# Patient Record
Sex: Female | Born: 1990 | Race: Black or African American | Hispanic: No | Marital: Single | Smoking: Current every day smoker
Health system: Southern US, Community
[De-identification: ages and names within clinical notes are randomized; demographics above are authoritative.]

## PROBLEM LIST (undated history)

## (undated) ENCOUNTER — Inpatient Hospital Stay (HOSPITAL_COMMUNITY): Payer: Self-pay

## (undated) DIAGNOSIS — Z8639 Personal history of other endocrine, nutritional and metabolic disease: Secondary | ICD-10-CM

## (undated) DIAGNOSIS — B999 Unspecified infectious disease: Secondary | ICD-10-CM

## (undated) DIAGNOSIS — Z8619 Personal history of other infectious and parasitic diseases: Secondary | ICD-10-CM

## (undated) DIAGNOSIS — Z87440 Personal history of urinary (tract) infections: Secondary | ICD-10-CM

## (undated) DIAGNOSIS — A599 Trichomoniasis, unspecified: Secondary | ICD-10-CM

## (undated) DIAGNOSIS — N750 Cyst of Bartholin's gland: Secondary | ICD-10-CM

## (undated) HISTORY — DX: Personal history of other endocrine, nutritional and metabolic disease: Z86.39

## (undated) HISTORY — DX: Personal history of other infectious and parasitic diseases: Z86.19

## (undated) HISTORY — DX: Personal history of urinary (tract) infections: Z87.440

## (undated) HISTORY — PX: NO PAST SURGERIES: SHX2092

## (undated) HISTORY — PX: WISDOM TOOTH EXTRACTION: SHX21

## (undated) HISTORY — DX: Trichomoniasis, unspecified: A59.9

---
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
## 2012-01-18 ENCOUNTER — Inpatient Hospital Stay (HOSPITAL_COMMUNITY)
Admission: AD | Admit: 2012-01-18 | Discharge: 2012-01-18 | Disposition: A | Discharge status: Home / Self Care | Payer: 59 | Source: Ambulatory Visit | Attending: Obstetrics and Gynecology | Admitting: Obstetrics and Gynecology

## 2012-01-18 ENCOUNTER — Encounter (HOSPITAL_COMMUNITY): Payer: Self-pay | Admitting: *Deleted

## 2012-01-18 DIAGNOSIS — O99891 Other specified diseases and conditions complicating pregnancy: Secondary | ICD-10-CM | POA: Insufficient documentation

## 2012-01-18 DIAGNOSIS — O479 False labor, unspecified: Secondary | ICD-10-CM

## 2012-01-18 DIAGNOSIS — R109 Unspecified abdominal pain: Secondary | ICD-10-CM | POA: Insufficient documentation

## 2012-01-18 DIAGNOSIS — O26899 Other specified pregnancy related conditions, unspecified trimester: Secondary | ICD-10-CM

## 2012-01-18 LAB — WET PREP, GENITAL

## 2012-01-18 LAB — AMNISURE RUPTURE OF MEMBRANE (ROM) NOT AT ARMC: Amnisure ROM: NEGATIVE

## 2012-01-18 NOTE — ED Provider Notes (Signed)
History   Jodi Mcdonald is an 21 y.o. Black female at 34 weeks per [facility] 02/29/12 who presents unannounced w/ CC of feeling like her period is starting and having menstrual like cramps, and when cramping happens, feels like something leaking out, and concerned may be leaking amniotic fluid.  Reports GFM.  Denies VB, UTI or PIH s/s; no resp or GI c/o's.  Seen in office Mon of this week and no c/o's at that time and doing well. Size sl greater than dates, so u/s scheduled for next visit for growth and afi.   Pregnancy r/f:  1.  H/o stds  2.  H/o gonorrhea 1st trimester 3.  Obese  4.  teen   Chief Complaint  Patient presents with  . Labor Eval   HPI  OB History    Grav Para Term Preterm Abortions TAB SAB Ect Mult Living   1               Past Medical History  Diagnosis Date  . No pertinent past medical history     Past Surgical History  Procedure Date  . Wisdom tooth extraction     History reviewed. No pertinent family history.  History  Substance Use Topics  . Smoking status: Former Games developer  . Smokeless tobacco: Not on file  . Alcohol Use: No    Allergies: No Known Allergies  Prescriptions prior to admission  Medication Sig Dispense Refill  . acetaminophen (TYLENOL) 500 MG tablet Take 1,000 mg by mouth every 6 (six) hours as needed. For pain      . Prenatal Vit-Fe Fumarate-FA (PRENATAL MULTIVITAMIN) TABS Take 1 tablet by mouth daily.        ROS--see History above Physical Exam   Blood pressure 110/62, pulse 88, temperature 98.8 F (37.1 C), temperature source Oral, resp. rate 20, height 5\' 4"  (1.626 m), weight 107.502 kg (237 lb), SpO2 99.00%.  EFM:150, reactive, mod variability, no decels TOCO: few ripples .Marland Kitchen Results for orders placed during the hospital encounter of 01/18/12 (from the past 24 hour(s))  WET PREP, GENITAL     Status: Abnormal   Collection Time   01/18/12 10:22 PM      Component Value Range   Yeast, Wet Prep NONE SEEN  NONE SEEN    Trich, Wet Prep  NONE SEEN  NONE SEEN    Clue Cells, Wet Prep FEW (*) NONE SEEN    WBC, Wet Prep HPF POC FEW (*) NONE SEEN   AMNISURE RUPTURE OF MEMBRANE (ROM)     Status: Normal   Collection Time   01/18/12 10:22 PM      Component Value Range   Amnisure ROM NEGATIVE     Physical Exam  Constitutional: She is oriented to person, place, and time. She appears well-developed and well-nourished. No distress.       Smiling, laughing  Cardiovascular: Normal rate.   Respiratory: Effort normal.  GI: Soft. Bowel sounds are normal.       gravid  Genitourinary:       Perineum/vulva dry;  SSE:  Scant amt white d/c in vault; neg pooling.  No lesion.  Cx L/closed/-3  Neurological: She is alert and oriented to person, place, and time.  Skin: Skin is warm and dry.    MAU Course  Procedures 1.  NST 2.  Wet prep 3.  amnisure  Assessment and Plan  1.  IUP at 34 weeks 2.  No s/s of PTL or ROM 3.  Reactive NST  1.  F/u as scheduled or prn worsening s/s 2.  Disc'd normal discomforts for end of 3rd trimester and comfort measures rev'd 3.  PTL precautions and FKC rev'd    Shailah Gibbins H 01/18/2012, 10:30 PM

## 2012-01-18 NOTE — Progress Notes (Signed)
SSE per CNM.  Wet prep and amnisure collected. VE done.

## 2012-01-18 NOTE — Progress Notes (Signed)
H. Steelman, CNM at bedside.  Assessment done and poc discussed with pt.  

## 2012-01-18 NOTE — Progress Notes (Signed)
Pt states she noticed leaking that started 2-3 days ago and has developed pelvic pressure

## 2012-02-27 ENCOUNTER — Encounter (INDEPENDENT_AMBULATORY_CARE_PROVIDER_SITE_OTHER): Payer: 59 | Admitting: Obstetrics and Gynecology

## 2012-02-27 DIAGNOSIS — Z3480 Encounter for supervision of other normal pregnancy, unspecified trimester: Secondary | ICD-10-CM

## 2012-03-05 ENCOUNTER — Inpatient Hospital Stay (HOSPITAL_COMMUNITY)
Admission: AD | Admit: 2012-03-05 | Discharge: 2012-03-08 | DRG: 775 | Disposition: A | Discharge status: Home / Self Care | Payer: Medicaid Other | Source: Ambulatory Visit | Attending: Obstetrics and Gynecology | Admitting: Obstetrics and Gynecology

## 2012-03-05 ENCOUNTER — Encounter (INDEPENDENT_AMBULATORY_CARE_PROVIDER_SITE_OTHER): Payer: 59 | Admitting: Registered Nurse

## 2012-03-05 ENCOUNTER — Other Ambulatory Visit (INDEPENDENT_AMBULATORY_CARE_PROVIDER_SITE_OTHER): Payer: 59

## 2012-03-05 ENCOUNTER — Encounter (HOSPITAL_COMMUNITY): Payer: Self-pay | Admitting: *Deleted

## 2012-03-05 DIAGNOSIS — Z22330 Carrier of Group B streptococcus: Secondary | ICD-10-CM

## 2012-03-05 DIAGNOSIS — O2690 Pregnancy related conditions, unspecified, unspecified trimester: Secondary | ICD-10-CM

## 2012-03-05 DIAGNOSIS — O2693 Pregnancy related conditions, unspecified, third trimester: Secondary | ICD-10-CM

## 2012-03-05 DIAGNOSIS — O4100X0 Oligohydramnios, unspecified trimester, not applicable or unspecified: Principal | ICD-10-CM | POA: Diagnosis present

## 2012-03-05 DIAGNOSIS — O480 Post-term pregnancy: Secondary | ICD-10-CM

## 2012-03-05 DIAGNOSIS — O99892 Other specified diseases and conditions complicating childbirth: Secondary | ICD-10-CM | POA: Diagnosis present

## 2012-03-05 DIAGNOSIS — Z3480 Encounter for supervision of other normal pregnancy, unspecified trimester: Secondary | ICD-10-CM

## 2012-03-05 DIAGNOSIS — O368190 Decreased fetal movements, unspecified trimester, not applicable or unspecified: Secondary | ICD-10-CM | POA: Diagnosis not present

## 2012-03-05 LAB — CBC
MCH: 28.3 pg (ref 26.0–34.0)
MCHC: 34 g/dL (ref 30.0–36.0)
MCV: 83.3 fL (ref 78.0–100.0)
Platelets: 258 10*3/uL (ref 150–400)
RBC: 4.49 MIL/uL (ref 3.87–5.11)

## 2012-03-05 MED ORDER — LACTATED RINGERS IV SOLN
INTRAVENOUS | Status: DC
  Administered 2012-03-05 – 2012-03-06 (×4): via INTRAVENOUS

## 2012-03-05 MED ORDER — LACTATED RINGERS IV SOLN
500.0000 mL | INTRAVENOUS | Status: DC | PRN
  Administered 2012-03-06: 1000 mL via INTRAVENOUS

## 2012-03-05 MED ORDER — ZOLPIDEM TARTRATE 10 MG PO TABS
10.0000 mg | ORAL_TABLET | Freq: Every evening | ORAL | Status: DC | PRN
  Administered 2012-03-05: 10 mg via ORAL
  Filled 2012-03-05: qty 1

## 2012-03-05 MED ORDER — DINOPROSTONE 10 MG VA INST
10.0000 mg | VAGINAL_INSERT | Freq: Once | VAGINAL | Status: AC
  Administered 2012-03-05: 10 mg via VAGINAL
  Filled 2012-03-05: qty 1

## 2012-03-05 MED ORDER — PENICILLIN G POTASSIUM 5000000 UNITS IJ SOLR
5.0000 10*6.[IU] | Freq: Once | INTRAMUSCULAR | Status: DC
  Filled 2012-03-05: qty 5

## 2012-03-05 MED ORDER — TERBUTALINE SULFATE 1 MG/ML IJ SOLN: 0.2500 mg | Freq: Once | INTRAMUSCULAR | Status: AC | PRN

## 2012-03-05 MED ORDER — OXYCODONE-ACETAMINOPHEN 5-325 MG PO TABS: 1.0000 | ORAL_TABLET | ORAL | Status: DC | PRN

## 2012-03-05 MED ORDER — OXYTOCIN 20 UNITS IN LACTATED RINGERS INFUSION - SIMPLE: 125.0000 mL/h | Freq: Once | INTRAVENOUS | Status: DC

## 2012-03-05 MED ORDER — IBUPROFEN 600 MG PO TABS: 600.0000 mg | ORAL_TABLET | Freq: Four times a day (QID) | ORAL | Status: DC | PRN

## 2012-03-05 MED ORDER — OXYTOCIN BOLUS FROM INFUSION
500.0000 mL | Freq: Once | INTRAVENOUS | Status: DC
  Filled 2012-03-05: qty 500

## 2012-03-05 MED ORDER — FLEET ENEMA 7-19 GM/118ML RE ENEM: 1.0000 | ENEMA | RECTAL | Status: DC | PRN

## 2012-03-05 MED ORDER — DEXTROSE 5 % IV SOLN
2.5000 10*6.[IU] | INTRAVENOUS | Status: DC
  Filled 2012-03-05 (×2): qty 2.5

## 2012-03-05 MED ORDER — CITRIC ACID-SODIUM CITRATE 334-500 MG/5ML PO SOLN: 30.0000 mL | ORAL | Status: DC | PRN

## 2012-03-05 MED ORDER — ACETAMINOPHEN 325 MG PO TABS: 650.0000 mg | ORAL_TABLET | ORAL | Status: DC | PRN

## 2012-03-05 MED ORDER — LIDOCAINE HCL (PF) 1 % IJ SOLN
30.0000 mL | INTRAMUSCULAR | Status: DC | PRN
  Administered 2012-03-06: 30 mL via SUBCUTANEOUS
  Filled 2012-03-05: qty 30

## 2012-03-05 MED ORDER — ONDANSETRON HCL 4 MG/2ML IJ SOLN: 4.0000 mg | Freq: Four times a day (QID) | INTRAMUSCULAR | Status: DC | PRN

## 2012-03-05 NOTE — H&P (Signed)
Jodi Mcdonald is a 21 y.o. female presenting for low AFI, denie uc, srom, vag bleeding, with +FM, agrees to cervidil induction. Problem list Gonorrhea Obesity AFI 5  History OB History    Grav Para Term Preterm Abortions TAB SAB Ect Mult Living   1              Past Medical History  Diagnosis Date  . No pertinent past medical history    Past Surgical History  Procedure Date  . Wisdom tooth extraction    Family History: family history is negative for Anesthesia problems. Social History:  reports that she has quit smoking. She does not have any smokeless tobacco history on file. She reports that she does not drink alcohol or use illicit drugs.  ROS  Dilation: 1 Effacement (%): 60 Station: -2 Exam by:: Kareem Cathey,cnm Blood pressure 119/69, pulse 83, temperature 98.4 F (36.9 C), temperature source Oral, resp. rate 20, height 5\' 4"  (1.626 m), weight 230 lb (104.327 kg), SpO2 100.00%. Exam Physical Exam calm, quiet, lungs clear bilaterally, AP RRR, abd soft, gravid, nt EGBUS WNL, vag 1 posterior 60 medium intact, vtx no edema lower legs Prenatal labs: ABO, Rh: --/--/O POS (06/29 0951)O post Antibody:  neg Rubella:  immune RPR:   NR HBsAg:   Neg HIV:   neg GBS: Positive (02/09 0000)   Assessment/Plan:  40 5/7 week IP oligohydraminos P cervidil, pitocin discussed with pt family at bedside and Pen G with active labor, collaboration with Dr. Max Fickle, [facility] 03/05/2012, 4:29 PM

## 2012-03-06 ENCOUNTER — Encounter (HOSPITAL_COMMUNITY): Payer: Self-pay | Admitting: Anesthesiology

## 2012-03-06 ENCOUNTER — Encounter (HOSPITAL_COMMUNITY): Payer: Self-pay | Admitting: Family Medicine

## 2012-03-06 ENCOUNTER — Inpatient Hospital Stay (HOSPITAL_COMMUNITY): Payer: Medicaid Other | Admitting: Anesthesiology

## 2012-03-06 MED ORDER — LANOLIN HYDROUS EX OINT: TOPICAL_OINTMENT | CUTANEOUS | Status: DC | PRN

## 2012-03-06 MED ORDER — MEDROXYPROGESTERONE ACETATE 150 MG/ML IM SUSP: 150.0000 mg | INTRAMUSCULAR | Status: DC | PRN

## 2012-03-06 MED ORDER — METHYLERGONOVINE MALEATE 0.2 MG PO TABS: 0.2000 mg | ORAL_TABLET | ORAL | Status: DC | PRN

## 2012-03-06 MED ORDER — EPHEDRINE 5 MG/ML INJ
10.0000 mg | INTRAVENOUS | Status: DC | PRN
  Filled 2012-03-06: qty 2
  Filled 2012-03-06: qty 4

## 2012-03-06 MED ORDER — ONDANSETRON HCL 4 MG/2ML IJ SOLN: 4.0000 mg | INTRAMUSCULAR | Status: DC | PRN

## 2012-03-06 MED ORDER — TETANUS-DIPHTH-ACELL PERTUSSIS 5-2.5-18.5 LF-MCG/0.5 IM SUSP: 0.5000 mL | Freq: Once | INTRAMUSCULAR | Status: DC

## 2012-03-06 MED ORDER — FENTANYL 2.5 MCG/ML BUPIVACAINE 1/10 % EPIDURAL INFUSION (WH - ANES)
INTRAMUSCULAR | Status: DC | PRN
  Administered 2012-03-06: 14 mL/h via EPIDURAL

## 2012-03-06 MED ORDER — DIPHENHYDRAMINE HCL 50 MG/ML IJ SOLN: 12.5000 mg | INTRAMUSCULAR | Status: DC | PRN

## 2012-03-06 MED ORDER — EPHEDRINE 5 MG/ML INJ
10.0000 mg | INTRAVENOUS | Status: DC | PRN
  Filled 2012-03-06: qty 2

## 2012-03-06 MED ORDER — FENTANYL 2.5 MCG/ML BUPIVACAINE 1/10 % EPIDURAL INFUSION (WH - ANES)
14.0000 mL/h | INTRAMUSCULAR | Status: DC
  Administered 2012-03-06: 14 mL/h via EPIDURAL
  Filled 2012-03-06 (×2): qty 60

## 2012-03-06 MED ORDER — BENZOCAINE-MENTHOL 20-0.5 % EX AERO: 1.0000 "application " | INHALATION_SPRAY | CUTANEOUS | Status: DC | PRN

## 2012-03-06 MED ORDER — PHENYLEPHRINE 40 MCG/ML (10ML) SYRINGE FOR IV PUSH (FOR BLOOD PRESSURE SUPPORT)
80.0000 ug | PREFILLED_SYRINGE | INTRAVENOUS | Status: DC | PRN
  Filled 2012-03-06: qty 5
  Filled 2012-03-06: qty 2

## 2012-03-06 MED ORDER — FENTANYL CITRATE 0.05 MG/ML IJ SOLN: 100.0000 ug | INTRAMUSCULAR | Status: DC | PRN

## 2012-03-06 MED ORDER — OXYCODONE-ACETAMINOPHEN 5-325 MG PO TABS: 1.0000 | ORAL_TABLET | ORAL | Status: DC | PRN

## 2012-03-06 MED ORDER — TERBUTALINE SULFATE 1 MG/ML IJ SOLN: 0.2500 mg | Freq: Once | INTRAMUSCULAR | Status: DC | PRN

## 2012-03-06 MED ORDER — BUTORPHANOL TARTRATE 2 MG/ML IJ SOLN
INTRAMUSCULAR | Status: AC
  Filled 2012-03-06: qty 1

## 2012-03-06 MED ORDER — LACTATED RINGERS IV SOLN
500.0000 mL | Freq: Once | INTRAVENOUS | Status: AC
  Administered 2012-03-06: 500 mL via INTRAVENOUS

## 2012-03-06 MED ORDER — IBUPROFEN 600 MG PO TABS
600.0000 mg | ORAL_TABLET | Freq: Four times a day (QID) | ORAL | Status: DC
  Administered 2012-03-06 – 2012-03-08 (×8): 600 mg via ORAL
  Filled 2012-03-06 (×7): qty 1

## 2012-03-06 MED ORDER — METHYLERGONOVINE MALEATE 0.2 MG/ML IJ SOLN: 0.2000 mg | INTRAMUSCULAR | Status: DC | PRN

## 2012-03-06 MED ORDER — PRENATAL MULTIVITAMIN CH
1.0000 | ORAL_TABLET | Freq: Every day | ORAL | Status: DC
  Administered 2012-03-07 – 2012-03-08 (×2): 1 via ORAL
  Filled 2012-03-06 (×2): qty 1

## 2012-03-06 MED ORDER — ZOLPIDEM TARTRATE 5 MG PO TABS: 5.0000 mg | ORAL_TABLET | Freq: Every evening | ORAL | Status: DC | PRN

## 2012-03-06 MED ORDER — MAGNESIUM HYDROXIDE 400 MG/5ML PO SUSP: 30.0000 mL | ORAL | Status: DC | PRN

## 2012-03-06 MED ORDER — SIMETHICONE 80 MG PO CHEW: 80.0000 mg | CHEWABLE_TABLET | ORAL | Status: DC | PRN

## 2012-03-06 MED ORDER — FERROUS SULFATE 325 (65 FE) MG PO TABS
325.0000 mg | ORAL_TABLET | Freq: Two times a day (BID) | ORAL | Status: DC
  Administered 2012-03-07 – 2012-03-08 (×4): 325 mg via ORAL
  Filled 2012-03-06 (×4): qty 1

## 2012-03-06 MED ORDER — BUTORPHANOL TARTRATE 2 MG/ML IJ SOLN
1.0000 mg | INTRAMUSCULAR | Status: DC | PRN
  Administered 2012-03-06 (×2): 1 mg via INTRAVENOUS
  Filled 2012-03-06: qty 1

## 2012-03-06 MED ORDER — WITCH HAZEL-GLYCERIN EX PADS: 1.0000 "application " | MEDICATED_PAD | CUTANEOUS | Status: DC | PRN

## 2012-03-06 MED ORDER — ONDANSETRON HCL 4 MG PO TABS: 4.0000 mg | ORAL_TABLET | ORAL | Status: DC | PRN

## 2012-03-06 MED ORDER — PENICILLIN G POTASSIUM 5000000 UNITS IJ SOLR
5.0000 10*6.[IU] | Freq: Once | INTRAVENOUS | Status: AC
  Administered 2012-03-06: 5 10*6.[IU] via INTRAVENOUS
  Filled 2012-03-06: qty 5

## 2012-03-06 MED ORDER — LIDOCAINE HCL (PF) 1 % IJ SOLN
INTRAMUSCULAR | Status: DC | PRN
  Administered 2012-03-06: 8 mL
  Administered 2012-03-06: 6 mL

## 2012-03-06 MED ORDER — OXYTOCIN 20 UNITS IN LACTATED RINGERS INFUSION - SIMPLE
1.0000 m[IU]/min | INTRAVENOUS | Status: DC
  Administered 2012-03-06: 2 m[IU]/min via INTRAVENOUS
  Filled 2012-03-06: qty 1000

## 2012-03-06 MED ORDER — DIBUCAINE 1 % RE OINT: 1.0000 "application " | TOPICAL_OINTMENT | RECTAL | Status: DC | PRN

## 2012-03-06 MED ORDER — PHENYLEPHRINE 40 MCG/ML (10ML) SYRINGE FOR IV PUSH (FOR BLOOD PRESSURE SUPPORT)
80.0000 ug | PREFILLED_SYRINGE | INTRAVENOUS | Status: DC | PRN
  Filled 2012-03-06: qty 2

## 2012-03-06 MED ORDER — SENNOSIDES-DOCUSATE SODIUM 8.6-50 MG PO TABS
2.0000 | ORAL_TABLET | Freq: Every day | ORAL | Status: DC
  Administered 2012-03-07: 2 via ORAL

## 2012-03-06 MED ORDER — LACTATED RINGERS IV SOLN
INTRAVENOUS | Status: DC
  Administered 2012-03-06: 18:00:00 via INTRAUTERINE

## 2012-03-06 MED ORDER — MISOPROSTOL 200 MCG PO TABS
ORAL_TABLET | ORAL | Status: AC
  Administered 2012-03-06: 800 ug
  Filled 2012-03-06: qty 4

## 2012-03-06 MED ORDER — DIPHENHYDRAMINE HCL 25 MG PO CAPS: 25.0000 mg | ORAL_CAPSULE | Freq: Four times a day (QID) | ORAL | Status: DC | PRN

## 2012-03-06 MED ORDER — PENICILLIN G POTASSIUM 5000000 UNITS IJ SOLR
2.5000 10*6.[IU] | INTRAVENOUS | Status: DC
  Administered 2012-03-06 (×3): 2.5 10*6.[IU] via INTRAVENOUS
  Filled 2012-03-06 (×5): qty 2.5

## 2012-03-06 NOTE — Progress Notes (Signed)
  Subjective: Breathing with contractions, declines pain medication at present.  Had small amount of bleeding when up to BR.  No further leaking.  Objective: BP 120/73  Pulse 90  Temp(Src) 98 F (36.7 C) (Oral)  Resp 18  Ht 5\' 4"  (1.626 m)  Wt 104.327 kg (230 lb)  BMI 39.48 kg/m2  SpO2 100%      FHT: Category 1 UC:   q 2-4 min, mild. SVE:   Dilation: 2 Effacement (%): 90 Station: -2 Exam by:: Manfred Arch, CNM Membranes palpated, no leaking noted.  Minimal bloody show with exam. Pitocin on 8 mu/min  Labs: Lab Results  Component Value Date   WBC 10.1 03/05/2012   HGB 12.7 03/05/2012   HCT 37.4 03/05/2012   MCV 83.3 03/05/2012   PLT 258 03/05/2012    Assessment / Plan: IUP at 40 6/7 weeks Induction for oligohydramnios, decreased FM  Continue induction with pitocin IV pain medication as needed (patient prefers to epidural) AROM as labor progresses.    Nigel Bridgeman 03/06/2012, 9:21 AM

## 2012-03-06 NOTE — Progress Notes (Signed)
H. Steelman, cnm at bedside to remove cervidil, unable to find cervidil.

## 2012-03-06 NOTE — Anesthesia Preprocedure Evaluation (Signed)
Anesthesia Evaluation  Patient identified by MRN, date of birth, ID band Patient awake and Patient unresponsive    Reviewed: Allergy & Precautions, H&P , NPO status , Patient's Chart, lab work & pertinent test results  Airway Mallampati: III TM Distance: >3 FB Neck ROM: full    Dental No notable dental hx.    Pulmonary neg pulmonary ROS,    Pulmonary exam normal       Cardiovascular negative cardio ROS      Neuro/Psych negative neurological ROS  negative psych ROS   GI/Hepatic negative GI ROS, Neg liver ROS,   Endo/Other  Morbid obesity  Renal/GU negative Renal ROS  negative genitourinary   Musculoskeletal negative musculoskeletal ROS (+)   Abdominal (+) + obese,   Peds negative pediatric ROS (+)  Hematology negative hematology ROS (+)   Anesthesia Other Findings   Reproductive/Obstetrics (+) Pregnancy                           Anesthesia Physical Anesthesia Plan  ASA: III  Anesthesia Plan: Epidural   Post-op Pain Management:    Induction:   Airway Management Planned:   Additional Equipment:   Intra-op Plan:   Post-operative Plan:   Informed Consent: I have reviewed the patients History and Physical, chart, labs and discussed the procedure including the risks, benefits and alternatives for the proposed anesthesia with the patient or authorized representative who has indicated his/her understanding and acceptance.     Plan Discussed with:   Anesthesia Plan Comments:         Anesthesia Quick Evaluation

## 2012-03-06 NOTE — Progress Notes (Signed)
  Subjective: Comfortable with epidural.  Sleeping at intervals--mother, FOB, and patient's brother in room.  Objective: BP 110/38  Pulse 84  Temp(Src) 97.9 F (36.6 C) (Oral)  Resp 18  Ht 5\' 4"  (1.626 m)  Wt 104.327 kg (230 lb)  BMI 39.48 kg/m2  SpO2 100%     Filed Vitals:   03/06/12 1423 03/06/12 1427 03/06/12 1432 03/06/12 1502  BP:  128/77 115/60 110/38  Pulse: 81 78 80 84  Temp:    97.9 F (36.6 C)  TempSrc:    Oral  Resp:  16 18 18   Height:      Weight:      SpO2:         FHT: 140s, minimal variability s/p 2 doses Stadol.   UC:   q 2-4 min, moderate quality. SVE:   Dilation: 5.5 Effacement (%): 90 Station: -2 Exam by:: Manfred Arch, CNM Membranes intact, vtx transverse position. Small amount bloody show.   Assessment / Plan: Induction of labor due to oligohydramnios Progression of labor  Plan: Defer AROM at present, due to fetal vtx still slightly high for AROM Continue pitocin to facilitate descent and dilation. Evaluate for AROM as labor progresses.   Nigel Bridgeman 03/06/2012, 3:28 PM

## 2012-03-06 NOTE — Progress Notes (Signed)
NSVD of a viable female.  

## 2012-03-06 NOTE — Progress Notes (Signed)
  Subjective: Comfortable with epidural.  Objective: BP 109/68  Pulse 99  Temp(Src) 98 F (36.7 C) (Axillary)  Resp 18  Ht 5\' 4"  (1.626 m)  Wt 104.327 kg (230 lb)  BMI 39.48 kg/m2  SpO2 100%   Total I/O In: 500 [Other:500] Out: -   FHT: 140s baseline, moderate variability, mild variables just after AROM. UC:   q 3 min per palpation, difficult to trace SVE:  8 cm, 100%, vtx, -1/0 station. AROM--minimal amount light MSF IUPC and FSE placed   Assessment / Plan: Transition labor Light MSF, oligo Mild variables s/p AROM  Will initiate amnioinfusion and observe. Await increased pressure and urge to push.  Nigel Bridgeman 03/06/2012, 5:56 PM

## 2012-03-06 NOTE — Progress Notes (Signed)
Jodi Mcdonald is a 21 y.o. G1P0000 at [redacted]w[redacted]d admitted for induction of labor due to Low amniotic fluid..  Subjective: Sleeping soundly on arrival to room.  S.o. At bedside.  Pt had ambien last night, and has slept well.  Objective: BP 108/67  Pulse 107  Temp(Src) 98.1 F (36.7 C) (Oral)  Resp 17  Ht 5\' 4"  (1.626 m)  Wt 104.327 kg (230 lb)  BMI 39.48 kg/m2  SpO2 100%      FHT:  FHR: 160 bpm, variability: moderate,  accelerations:  Present,  decelerations:  Present occ'l mild variable UC:   Occasional ctx SVE:   Dilation: 1.5 Effacement (%): 80 Station: -2 Exam by:: H. Tamika Nou, cnm No cervidil in vault, and not found in bed. Labs: Lab Results  Component Value Date   WBC 10.1 03/05/2012   HGB 12.7 03/05/2012   HCT 37.4 03/05/2012   MCV 83.3 03/05/2012   PLT 258 03/05/2012    Assessment / Plan: 1.  40.6  2.  IOL secondary to Oligo  3.  GBS pos  Labor: Cervidil dislodged at some time during night, and pt unaware Preeclampsia:  no signs or symptoms of toxicity Fetal Wellbeing:  Category I Pain Control:  Labor support without medications I/D:  n/a Anticipated MOD:  NSVD 1.  Will begin pitocin and PCN at 0600. Wolf Boulay H 03/06/2012, 5:26 AM

## 2012-03-06 NOTE — Anesthesia Procedure Notes (Signed)
Epidural Patient location during procedure: OB Start time: 03/06/2012 2:00 PM End time: 03/06/2012 2:04 PM Reason for block: procedure for pain  Staffing Anesthesiologist: Sandrea Hughs Performed by: anesthesiologist   Preanesthetic Checklist Completed: patient identified, site marked, surgical consent, pre-op evaluation, timeout performed, IV checked, risks and benefits discussed and monitors and equipment checked  Epidural Patient position: sitting Prep: site prepped and draped and DuraPrep Patient monitoring: continuous pulse ox and blood pressure Approach: midline Injection technique: LOR air  Needle:  Needle type: Tuohy  Needle gauge: 17 G Needle length: 9 cm Needle insertion depth: 6 cm Catheter type: closed end flexible Catheter size: 19 Gauge Catheter at skin depth: 11 cm Test dose: negative and Other  Assessment Sensory level: T9 Events: blood not aspirated, injection not painful, no injection resistance, negative IV test and no paresthesia

## 2012-03-06 NOTE — Progress Notes (Signed)
  Subjective: Sleeping at present--received 2nd dose Stadol at 12 noon.  Objective: BP 137/62  Pulse 84  Temp(Src) 97.9 F (36.6 C) (Oral)  Resp 18  Ht 5\' 4"  (1.626 m)  Wt 104.327 kg (230 lb)  BMI 39.48 kg/m2  SpO2 100%      FHT:  Category 1 UC:    q 3 minutes  SVE:   Dilation: 3.5 Effacement (%): 90 Station: -2 Exam by:: Tressia Danas RN  Pitocin on 12 mu/min.  Assessment / Plan: Induction of labor due to oligohydramnios, decreased FM  Will continue to observe.  Nigel Bridgeman 03/06/2012, 12:49 PM

## 2012-03-07 LAB — CBC
HCT: 32.6 % — ABNORMAL LOW (ref 36.0–46.0)
Hemoglobin: 10.7 g/dL — ABNORMAL LOW (ref 12.0–15.0)
MCH: 27.8 pg (ref 26.0–34.0)
MCHC: 32.8 g/dL (ref 30.0–36.0)
MCV: 84.7 fL (ref 78.0–100.0)

## 2012-03-07 NOTE — Progress Notes (Signed)
UR chart review completed.  

## 2012-03-07 NOTE — Anesthesia Postprocedure Evaluation (Signed)
Anesthesia Post Note  Patient: Jodi Mcdonald  Procedure(s) Performed: * No procedures listed *  Anesthesia type: Epidural  Patient location: Mother/Baby  Post pain: Pain level controlled  Post assessment: Post-op Vital signs reviewed  Last Vitals:  Filed Vitals:   03/07/12 0300  BP: 107/64  Pulse: 80  Temp: 37.4 C  Resp: 16    Post vital signs: Reviewed  Level of consciousness: awake  Complications: No apparent anesthesia complications

## 2012-03-07 NOTE — Progress Notes (Signed)
Patient ID: Jodi Mcdonald, female   DOB: 24-Jun-1991, 21 y.o.   MRN: [id number] Post Partum Day 1 Subjective: no complaints, up ad lib without syncope, voiding, tolerating PO, + flatus  Denies any pain BF started Mood stable, bonding well   Objective: Blood pressure 107/64, pulse 80, temperature 99.3 F (37.4 C), temperature source Oral, resp. rate 16, height 5\' 4"  (1.626 m), weight 104.327 kg (230 lb), SpO2 99.00%, unknown if currently breastfeeding.  Physical Exam:  General: alert and no distress Lungs: CTAB Heart: RRR Breasts: soft, nipples intact Lochia: appropriate Uterine Fundus: firm Perineum: WNL DVT Evaluation: No evidence of DVT seen on physical exam. Negative Homan's sign. No significant calf/ankle edema.   Basename 03/07/12 0558 03/05/12 1700  HGB 10.7* 12.7  HCT 32.6* 37.4    Assessment/Plan: Plan for discharge tomorrow, Breastfeeding, Lactation consult and Contraception undecided, discuss various methods Stable PP Continue current care Plans OP circumcision      LOS: 2 days   Syair Fricker M 03/07/2012, 8:00 AM

## 2012-03-07 NOTE — Addendum Note (Signed)
Addendum  created 03/07/12 1058 by Graciela Husbands, CRNA   Modules edited:Charges VN, Notes Section

## 2012-03-07 NOTE — Anesthesia Postprocedure Evaluation (Signed)
  Anesthesia Post-op Note  Patient: Jodi Mcdonald  Procedure(s) Performed: * No procedures listed *  Patient Location: 124  Anesthesia Type: Epidural  Level of Consciousness: awake, alert  and oriented  Airway and Oxygen Therapy: Patient Spontanous Breathing  Post-op Pain: mild  Post-op Assessment: Post-op Vital signs reviewed, Patient's Cardiovascular Status Stable, No headache, No backache, No residual numbness and No residual motor weakness  Post-op Vital Signs: Reviewed and stable  Complications: No apparent anesthesia complications

## 2012-03-08 MED ORDER — IBUPROFEN 600 MG PO TABS: 600.0000 mg | ORAL_TABLET | Freq: Four times a day (QID) | ORAL | Status: AC | PRN

## 2012-03-08 MED ORDER — FERROUS SULFATE 325 (65 FE) MG PO TABS: 325.0000 mg | ORAL_TABLET | Freq: Two times a day (BID) | ORAL | Status: DC

## 2012-03-08 NOTE — Discharge Instructions (Signed)
Vaginal Birth After Cesarean Delivery Vaginal birth after Cesarean delivery (VBAC) is giving birth vaginally after previously delivering a baby by a cesarean. In the past, if a woman had a Cesarean delivery, all births afterwards would be done by Cesarean delivery. This is no longer true. It can be safe for the mother to try a vaginal delivery after having a Cesarean. The final decision to have a VBAC or repeat Cesarean delivery should be between the patient and her caregiver. The risks and benefits can be discussed relative to the reason for, and the type of the previous Cesarean delivery. WOMEN WHO PLAN TO HAVE A VBAC SHOULD CHECK WITH THEIR DOCTOR TO BE SURE THAT:  The previous Cesarean was done with a low transverse uterine incision (not a vertical classical incision).   The birth canal is big enough for the baby.   There were no other operations on the uterus.   They will have an electronic fetal monitor (EFM) on at all times during labor.   An operating room would be available and ready in case an emergency Cesarean is needed.   A doctor and surgical nursing staff would be available at all times during labor to be ready to do an emergency Cesarean if necessary.   An anesthesiologist would be present in case an emergency Cesarean is needed.   The nursery is prepared and has adequate personnel and necessary equipment available to care for the baby in case of an emergency Cesarean.  BENEFITS OF VBAC:  Shorter stay in the hospital.   Lower delivery, nursery and hospital costs.   Less blood loss and need for blood transfusions.   Less fever and discomfort from major surgery.   Lower risk of blood clots.   Lower risk of infection.   Shorter recovery after going home.   Lower risk of other surgical complications, such as opening of the incision or hernia in the incision.   Decreased risk of injury to other organs.   Decreased risk for having to remove the uterus (hysterectomy).     Decreased risk for the placenta to completely or partially cover the opening of the uterus (placenta previa) with a future pregnancy.   Ability to have a larger family if desired.  RISKS OF A VBAC:  Rupture of the uterus.   Having to remove the uterus (hysterectomy) if it ruptures.   All the complications of major surgery and/or injury to other organs.   Excessive bleeding, blood clots and infection.   Lower Apgar scores (method to evaluate the newborn based on appearance, pulse, grimace, activity, and respiration) and more risks to the baby.   There is a higher risk of uterine rupture if you induce or augment labor.   There is a higher risk of uterine rupture if you use medications to ripen the cervix.  VBAC SHOULD NOT BE DONE IF:  The previous Cesarean was done with a vertical (classical) or T-shaped incision, or you do not know what kind of an incision was made.   You had a ruptured uterus.   You had surgery on your uterus.   You have medical or obstetrical problems.   There are problems with the baby.   There were two previous Cesarean deliveries and no vaginal deliveries.  OTHER FACTS TO KNOW ABOUT VBAC:  It is safe to have an epidural anesthetic with VBAC.   It is safe to turn the baby from a breech position (attempt an external cephalic version).   It is  safe to try a VBAC with twins.   Pregnancies later than 40 weeks have not been successful with VBAC.   There is an increased failure rate of a VBAC in obese pregnant women.   There is an increased failure rate with VABC if the baby weighs 8.8 pounds (4000 grams) or more.   There is an increased failure rate if the time between the Cesarean and VBAC is less than 19 months.   There is an increased failure rate if pre-eclampsia is present (high blood pressure, protein in the urine and swelling of face and extremities).   VBAC is very successful if there was a previous vaginal birth.   VBAC is very successful  when the labor starts spontaneously before the due date.   Delivery of VBAC is similar to having a normal spontaneous vaginal delivery.  It is important to discuss VBAC with your caregiver early in the pregnancy so you can understand the risks, benefits and options. It will give you time to decide what is best in your particular case relevant to the reason for your previous Cesarean delivery. It should be understood that medical changes in the mother or pregnancy may occur during the pregnancy, which make it necessary to change you or your caregiver's initial decision. The counseling, concerns and decisions should be documented in the medical record and signed by all parties.  Breastfeeding BENEFITS OF BREASTFEEDING For the baby  The first milk (colostrum) helps the baby's digestive system function better.   There are antibodies from the mother in the milk that help the baby fight off infections.   The baby has a lower incidence of asthma, allergies, and SIDS (sudden infant death syndrome).   The nutrients in breast milk are better than formulas for the baby and helps the baby's brain grow better.   Babies who breastfeed have less gas, colic, and constipation.  For the mother  Breastfeeding helps develop a very special bond between mother and baby.   It is more convenient, always available at the correct temperature and cheaper than formula feeding.   It burns calories in the mother and helps with losing weight that was gained during pregnancy.   It makes the uterus contract back down to normal size faster and slows bleeding following delivery.   Breastfeeding mothers have a lower risk of developing breast cancer.  NURSE FREQUENTLY  A healthy, full-term baby may breastfeed as often as every hour or space his or her feedings to every 3 hours.   How often to nurse will vary from baby to baby. Watch your baby for signs of hunger, not the clock.   Nurse as often as the baby requests,  or when you feel the need to reduce the fullness of your breasts.   Awaken the baby if it has been 3 to 4 hours since the last feeding.   Frequent feeding will help the mother make more milk and will prevent problems like sore nipples and engorgement of the breasts.  BABY'S POSITION AT THE BREAST  Whether lying down or sitting, be sure that the baby's tummy is facing your tummy.   Support the breast with 4 fingers underneath the breast and the thumb above. Make sure your fingers are well away from the nipple and baby's mouth.   Stroke the baby's lips and cheek closest to the breast gently with your finger or nipple.   When the baby's mouth is open wide enough, place all of your nipple and as much  of the dark area around the nipple as possible into your baby's mouth.   Pull the baby in close so the tip of the nose and the baby's cheeks touch the breast during the feeding.  FEEDINGS  The length of each feeding varies from baby to baby and from feeding to feeding.   The baby must suck about 2 to 3 minutes for your milk to get to him or her. This is called a "let down." For this reason, allow the baby to feed on each breast as long as he or she wants. Your baby will end the feeding when he or she has received the right balance of nutrients.   To break the suction, put your finger into the corner of the baby's mouth and slide it between his or her gums before removing your breast from his or her mouth. This will help prevent sore nipples.  REDUCING BREAST ENGORGEMENT  In the first week after your baby is born, you may experience signs of breast engorgement. When breasts are engorged, they feel heavy, warm, full, and may be tender to the touch. You can reduce engorgement if you:   Nurse frequently, every 2 to 3 hours. Mothers who breastfeed early and often have fewer problems with engorgement.   Place light ice packs on your breasts between feedings. This reduces swelling. Wrap the ice packs  in a lightweight towel to protect your skin.   Apply moist hot packs to your breast for 5 to 10 minutes before each feeding. This increases circulation and helps the milk flow.   Gently massage your breast before and during the feeding.   Make sure that the baby empties at least one breast at every feeding before switching sides.   Use a breast pump to empty the breasts if your baby is sleepy or not nursing well. You may also want to pump if you are returning to work or or you feel you are getting engorged.   Avoid bottle feeds, pacifiers or supplemental feedings of water or juice in place of breastfeeding.   Be sure the baby is latched on and positioned properly while breastfeeding.   Prevent fatigue, stress, and anemia.   Wear a supportive bra, avoiding underwire styles.   Eat a balanced diet with enough fluids.  If you follow these suggestions, your engorgement should improve in 24 to 48 hours. If you are still experiencing difficulty, call your lactation consultant or caregiver. IS MY BABY GETTING ENOUGH MILK? Sometimes, mothers worry about whether their babies are getting enough milk. You can be assured that your baby is getting enough milk if:  The baby is actively sucking and you hear swallowing.   The baby nurses at least 8 to 12 times in a 24 hour time period. Nurse your baby until he or she unlatches or falls asleep at the first breast (at least 10 to 20 minutes), then offer the second side.   The baby is wetting 5 to 6 disposable diapers (6 to 8 cloth diapers) in a 24 hour period by 27 to 21 days of age.   The baby is having at least 2 to 3 stools every 24 hours for the first few months. Breast milk is all the food your baby needs. It is not necessary for your baby to have water or formula. In fact, to help your breasts make more milk, it is best not to give your baby supplemental feedings during the early weeks.   The stool should be  soft and yellow.   The baby should gain  4 to 7 ounces per week after he is 49 days old.  TAKE CARE OF YOURSELF Take care of your breasts by:  Bathing or showering daily.   Avoiding the use of soaps on your nipples.   Start feedings on your left breast at one feeding and on your right breast at the next feeding.   You will notice an increase in your milk supply 2 to 5 days after delivery. You may feel some discomfort from engorgement, which makes your breasts very firm and often tender. Engorgement "peaks" out within 24 to 48 hours. In the meantime, apply warm moist towels to your breasts for 5 to 10 minutes before feeding. Gentle massage and expression of some milk before feeding will soften your breasts, making it easier for your baby to latch on. Wear a well fitting nursing bra and air dry your nipples for 10 to 15 minutes after each feeding.   Only use cotton bra pads.   Only use pure lanolin on your nipples after nursing. You do not need to wash it off before nursing.  Take care of yourself by:   Eating well-balanced meals and nutritious snacks.   Drinking milk, fruit juice, and water to satisfy your thirst (about 8 glasses a day).   Getting plenty of rest.   Increasing calcium in your diet (1200 mg a day).   Avoiding foods that you notice affect the baby in a bad way.  SEEK MEDICAL CARE IF:   You have any questions or difficulty with breastfeeding.   You need help.   You have a hard, red, sore area on your breast, accompanied by a fever of 100.5 F (38.1 C) or more.   Your baby is too sleepy to eat well or is having trouble sleeping.   Your baby is wetting less than 6 diapers per day, by 34 days of age.   Your baby's skin or white part of his or her eyes is more yellow than it was in the hospital.   You feel depressed.  Document Released: 12/12/2005 Document Revised: 12/01/2011 Document Reviewed: 07/27/2009 [facility] Patient Information 2012 [place], [place].

## 2012-03-08 NOTE — Discharge Summary (Signed)
  Obstetric Discharge Summary Reason for Admission: induction of labor Prenatal Procedures: none and ultrasound Intrapartum Procedures: spontaneous vaginal delivery Postpartum Procedures: none Complications-Operative and Postpartum: 2nd degree perineal laceration  Temp:  [98 F (36.7 C)-98.5 F (36.9 C)] 98.3 F (36.8 C) (03/14 0630) Pulse Rate:  [75-93] 75  (03/14 0630) Resp:  [18-20] 20  (03/14 0630) BP: (98-118)/(64-73) 113/71 mmHg (03/14 0630) Hemoglobin  Date Value Range Status  03/07/2012 10.7* 12.0-15.0 (g/dL) Final     HCT  Date Value Range Status  03/07/2012 32.6* 36.0-46.0 (%) Final    S: Day 2 PP vaginal. Induction for low AFI. Denies complaints. Doing well. Breastfeeding going well. Voiding and ambulating without difficulty.  O:VSS  Assessment/Plan General: alert, oriented. Mood and affect appropriate. Chest: Clear Heart: RRR Lochia: appropriate, minimal Fundus: Firm Negative Homan's-No signs or symptoms of DVT Anemia Obese Breastfeeding D/C to home today Desires Nexplanon as BCM RTO X 5 weeks for PP exam RTO x 6 weeks for Nexplanon insertion Rev'd signs and symptoms to report Discussed R&B's of Nexplanon Advised to call and schedule in office circumcision   Hospital Course:  Hospital Course: Admitted for induction of labor for low AFI. Positive GBS. Progressed to fully dilated. Delivery was performed by V. Emilee Hero, CNM without difficulty. Patient and baby tolerated the procedure without difficulty, with a 2nd degree bilateral labial minora laceration noted. Infant to FTN. Mother and infant then had an uncomplicated postpartum course, with breast feeding going well. Mom's physical exam was WNL, and she was discharged home in stable condition. Contraception plan was Nexplanon.  She received adequate benefit from po pain medications.  Discharge Diagnoses: Term Pregnancy-delivered  Discharge Information: Date: 03/08/2012 Activity: nothing in vagina x 6 weeks    Diet: routine Medications:  Medication List  As of 03/08/2012  8:58 AM   START taking these medications         ferrous sulfate 325 (65 FE) MG tablet   Take 1 tablet (325 mg total) by mouth 2 (two) times daily with a meal.      ibuprofen 600 MG tablet   Commonly known as: ADVIL,MOTRIN   Take 1 tablet (600 mg total) by mouth every 6 (six) hours as needed for pain.         CONTINUE taking these medications         prenatal multivitamin Tabs          Where to get your medications    These are the prescriptions that you need to pick up.   You may get these medications from any pharmacy.         ferrous sulfate 325 (65 FE) MG tablet   ibuprofen 600 MG tablet           Condition: stable Instructions: refer to practice specific booklet Discharge to: home Follow-up Information    Follow up with [facility]. Schedule an appointment as soon as possible for a visit in 5 weeks.         Newborn Data: Live born  Information for the patient's newborn:  Kilmartin, Lajoyce Tamura [[id number]]  female ; APGAR 9,9 ; weight 7 lbs. 4.9 oz    Kizzie Fantasia CORI 03/08/2012, 8:58 AM

## 2012-03-09 ENCOUNTER — Other Ambulatory Visit: Payer: 59

## 2012-03-09 ENCOUNTER — Encounter: Payer: 59 | Admitting: Obstetrics and Gynecology

## 2012-04-17 ENCOUNTER — Ambulatory Visit: Payer: 59 | Admitting: Registered Nurse

## 2012-04-17 ENCOUNTER — Ambulatory Visit: Payer: 59 | Admitting: Obstetrics and Gynecology

## 2012-05-02 ENCOUNTER — Ambulatory Visit (INDEPENDENT_AMBULATORY_CARE_PROVIDER_SITE_OTHER): Payer: 59 | Admitting: Obstetrics and Gynecology

## 2012-05-02 VITALS — BP 100/60 | HR 72 | Temp 98.5°F | Resp 16 | Ht 64.8 in | Wt 217.0 lb

## 2012-05-02 DIAGNOSIS — Z309 Encounter for contraceptive management, unspecified: Secondary | ICD-10-CM

## 2012-05-02 DIAGNOSIS — N898 Other specified noninflammatory disorders of vagina: Secondary | ICD-10-CM

## 2012-05-02 LAB — POCT WET PREP (WET MOUNT)
Clue Cells Wet Prep Whiff POC: NEGATIVE
pH: 4.5

## 2012-05-02 NOTE — Progress Notes (Signed)
Date of delivery: 03/06/2012 Female Name: Jodi Mcdonald Vaginal delivery:yes Cesarean section:no Tubal ligation:no GDM:no Breast Feeding:yes Bottle Feeding:yes Post-Partum Blues:no Abnormal pap:no Normal GU function: yes Normal GI function:no Returning to work:no  No complaints Wants implanon  ROS: noncontributory  Pelvic exam:  VULVA: normal appearing vulva with no masses, tenderness or lesions, VAGINA: normal appearing vagina with normal color and discharge, no lesions, CERVIX: normal appearing cervix without discharge or lesions,  UTERUS: uterus is normal size, shape, consistency and nontender,  ADNEXA: normal adnexa in size, nontender and no masses.  A/P Wet prep sched implanon next available.  R/b/a discussed.  No unprotected IC.

## 2012-05-18 ENCOUNTER — Ambulatory Visit (INDEPENDENT_AMBULATORY_CARE_PROVIDER_SITE_OTHER): Payer: 59 | Admitting: Obstetrics and Gynecology

## 2012-05-18 ENCOUNTER — Encounter: Payer: Self-pay | Admitting: Obstetrics and Gynecology

## 2012-05-18 VITALS — BP 100/70 | HR 70 | Ht 64.0 in | Wt 219.0 lb

## 2012-05-18 DIAGNOSIS — Z30017 Encounter for initial prescription of implantable subdermal contraceptive: Secondary | ICD-10-CM

## 2012-05-18 DIAGNOSIS — Z87440 Personal history of urinary (tract) infections: Secondary | ICD-10-CM

## 2012-05-18 DIAGNOSIS — Z309 Encounter for contraceptive management, unspecified: Secondary | ICD-10-CM

## 2012-05-18 DIAGNOSIS — A599 Trichomoniasis, unspecified: Secondary | ICD-10-CM | POA: Insufficient documentation

## 2012-05-18 DIAGNOSIS — Z87898 Personal history of other specified conditions: Secondary | ICD-10-CM

## 2012-05-18 DIAGNOSIS — IMO0001 Reserved for inherently not codable concepts without codable children: Secondary | ICD-10-CM

## 2012-05-18 DIAGNOSIS — Z8619 Personal history of other infectious and parasitic diseases: Secondary | ICD-10-CM | POA: Insufficient documentation

## 2012-05-18 DIAGNOSIS — Z8639 Personal history of other endocrine, nutritional and metabolic disease: Secondary | ICD-10-CM

## 2012-05-18 LAB — POCT URINE PREGNANCY: Preg Test, Ur: NEGATIVE

## 2012-05-18 MED ORDER — ETONOGESTREL 68 MG ~~LOC~~ IMPL
68.0000 mg | DRUG_IMPLANT | Freq: Once | SUBCUTANEOUS | Status: AC
  Administered 2012-05-18: 68 mg via SUBCUTANEOUS

## 2012-05-18 MED ORDER — ETONOGESTREL 68 MG ~~LOC~~ IMPL: 68.0000 mg | DRUG_IMPLANT | Freq: Once | SUBCUTANEOUS | Status: DC

## 2012-05-18 NOTE — Progress Notes (Signed)
Addended by: Henreitta Leber on: 05/18/2012 03:52 PM   Modules accepted: Orders

## 2012-05-18 NOTE — Patient Instructions (Signed)
Call [facility] [phone]:  -for temperature of 100.4 degrees Fahrenheit or more -pain not improved with over the counter pain medications (Ibuprofen, Advil, Aleve,     Tylenol or acetaminophen) -for excessive bleeding from insertion site -for excessive swelling redness or green drainage from your insertion site -for any other concerns  Use a back-up method of birth control for the next 4 weeks    Call [facility] [phone]:  -for temperature of 100.4 degrees Fahrenheit or more -pain not improved with over the counter pain medications (Ibuprofen, Advil, Aleve,     Tylenol or acetaminophen) -for excessive bleeding from insertion site -for excessive swelling redness or green drainage from your insertion site -for any other concerns  Use a back-up method of birth control for the next 4 weeks  

## 2012-05-18 NOTE — Progress Notes (Signed)
20 YO for Nexplanon insertion.  Had a SVB, 03/06/12 Jodi Mcdonald) and is breastfeeding.   O: UPT-negative  Left medial upper arm: Nexplanon placed per protocol without difficulty. Device palpated by patient and clinician.Dressed with sterile band-aids, 4 x 4 gauze with Kling pressure bandage  Lot # [id number]  A: Nexplanon Insertion  P: Reviewed signs & symptoms of infection and wound care.     RTO-1 week, use contraceptive back up 4 weeks   Jodi Henrichs, PA-C

## 2012-05-18 NOTE — Progress Notes (Signed)
Addended byWinfred Leeds on: 05/18/2012 11:55 AM   Modules accepted: Orders

## 2012-05-30 ENCOUNTER — Encounter: Payer: 59 | Admitting: Obstetrics and Gynecology

## 2012-08-13 ENCOUNTER — Telehealth: Payer: Self-pay | Admitting: Obstetrics and Gynecology

## 2012-08-13 NOTE — Telephone Encounter (Signed)
Spoke with pt rgd msg pt states had nexplanon inserted at the end of may experiencing irreg bleeding advised pt will experience irreg bleeding first 3-6 months of insertion pt voice understanding

## 2013-01-04 IMAGING — US US OB TRANSVAGINAL
1 series · 14 of 28 positions shown · non-contrast
Comparison: none

[Series 1: us ob transvaginal · 14 of 31 slices shown]
[im 2/31]
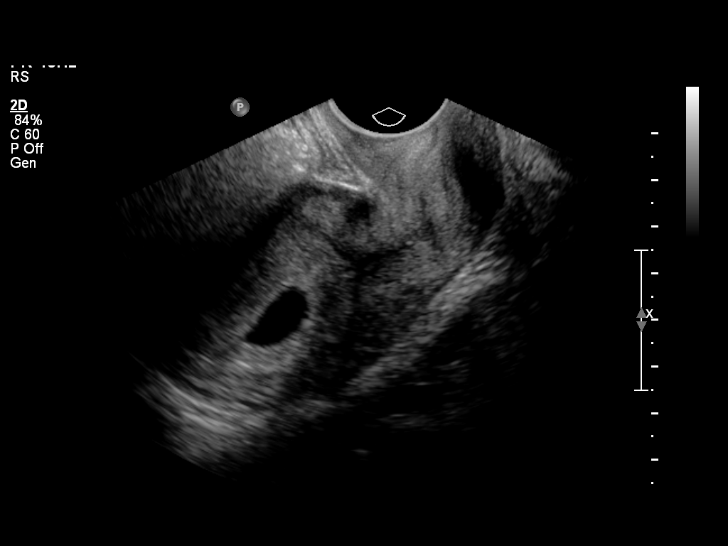
[im 4/31]
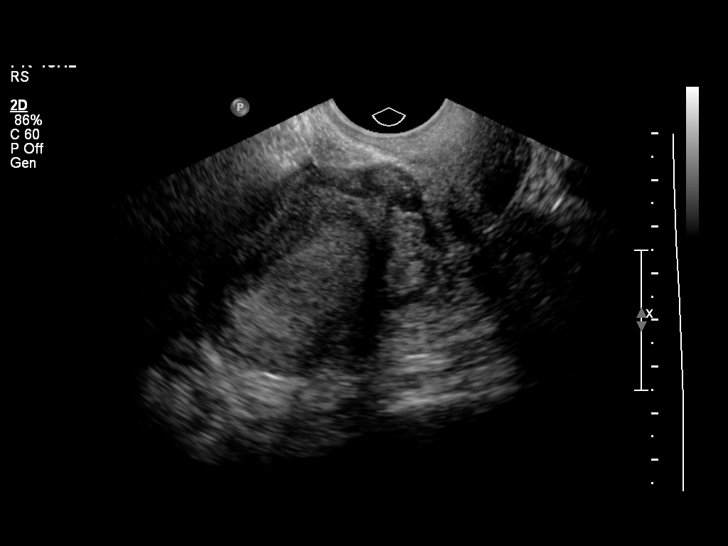
[im 6/31]
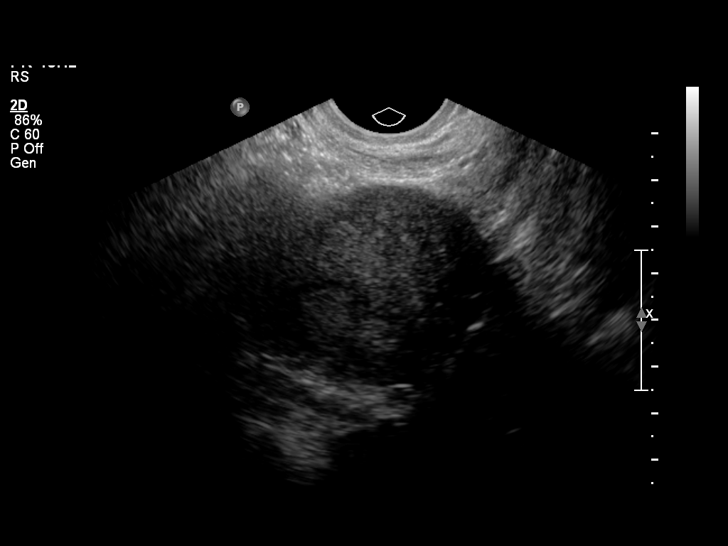
[im 8/31]
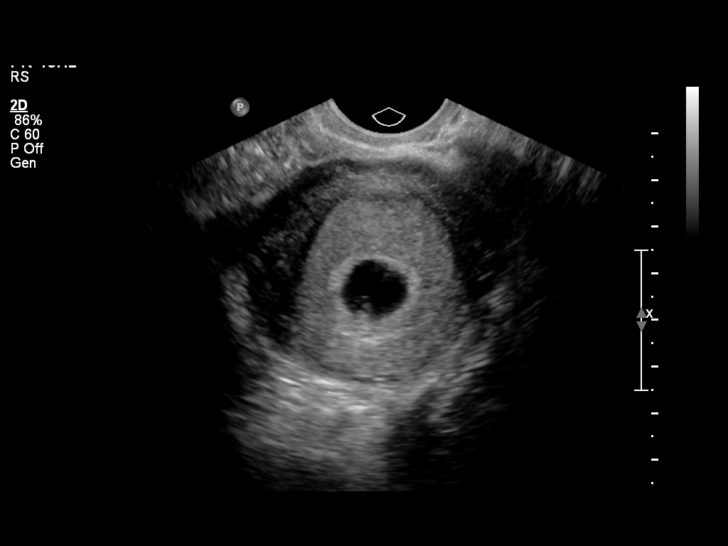
[im 11/31]
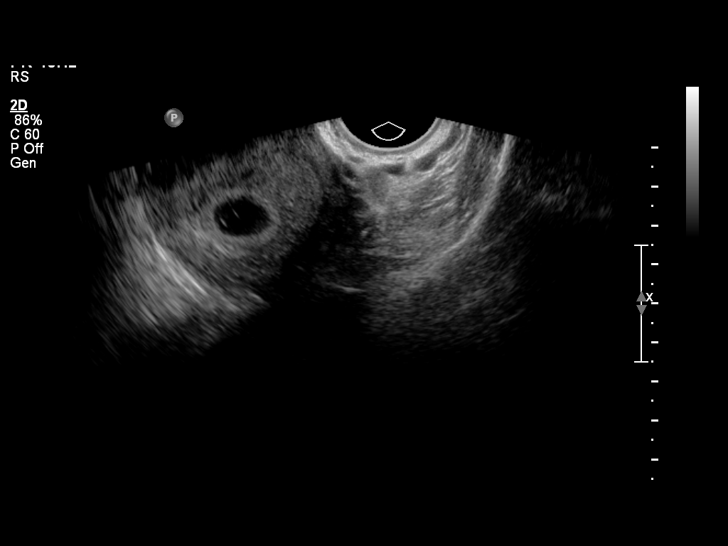
[im 13/31]
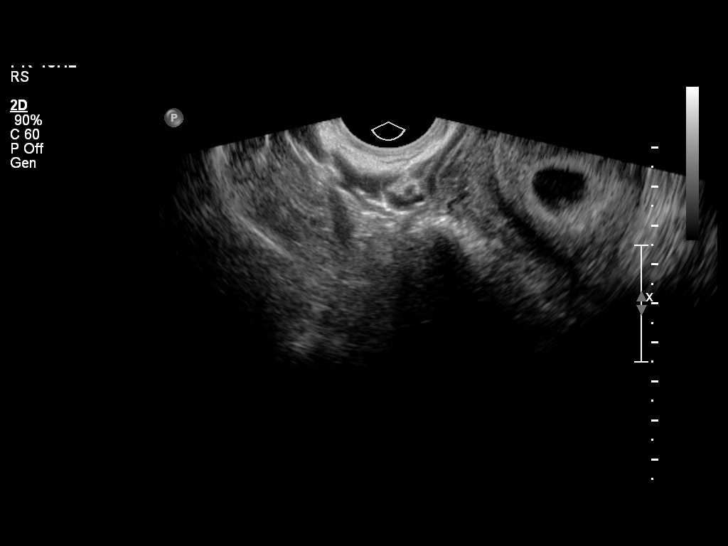
[im 15/31]
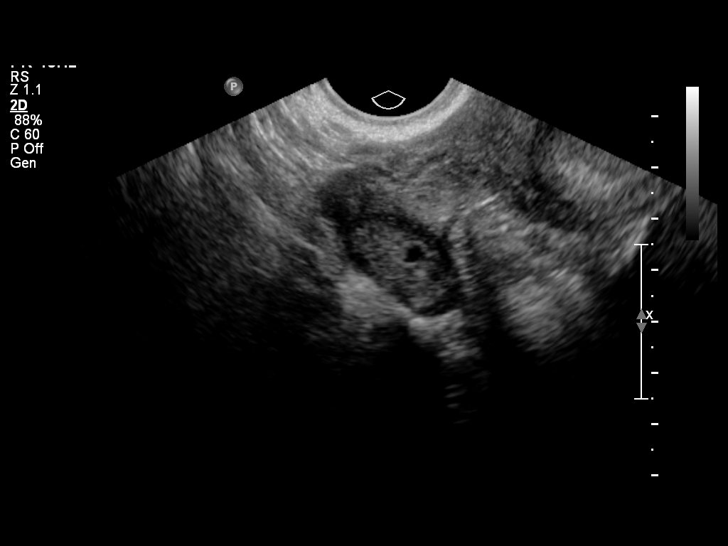
[im 17/31]
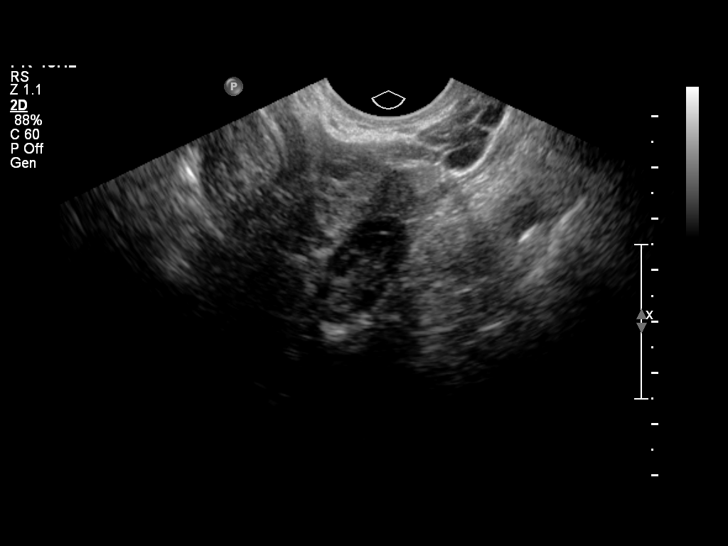
[im 19/31]
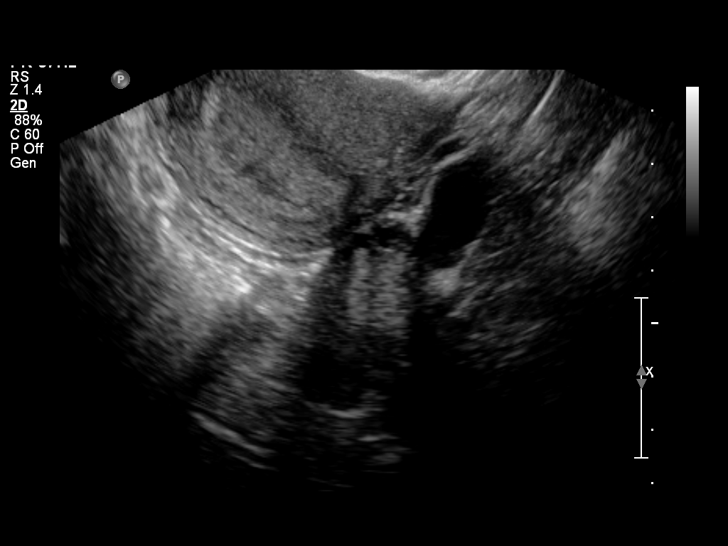
[im 22/31]
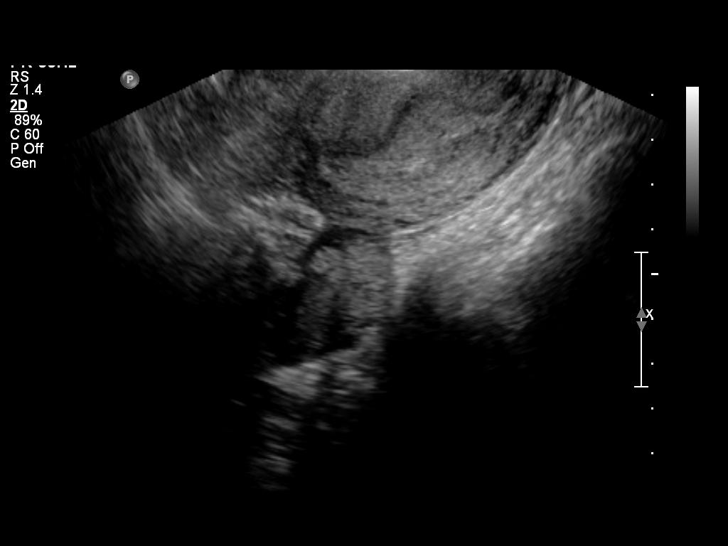
[im 24/31]
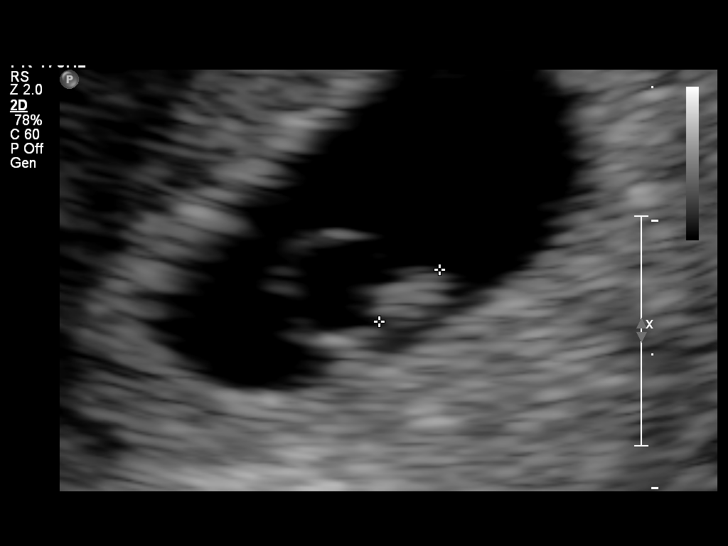
[im 26/31]
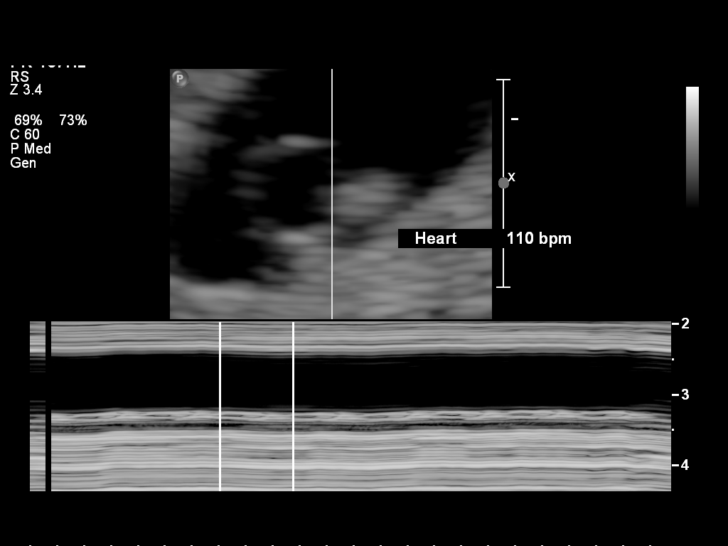
[im 28/31]
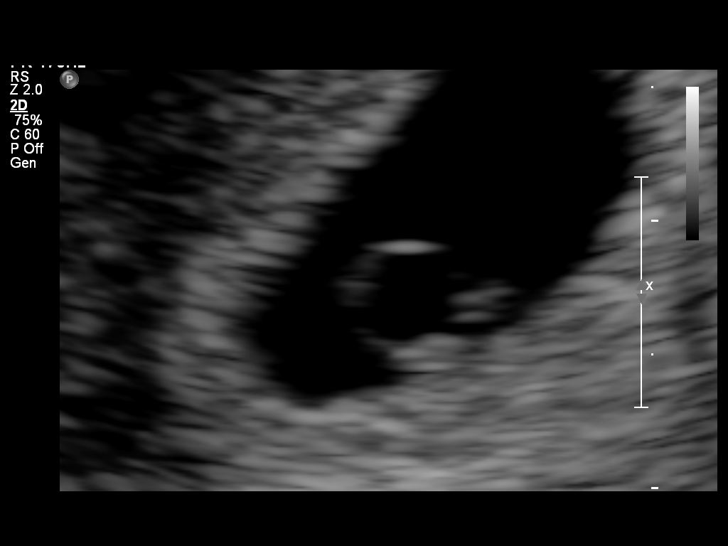
[im 31/31]
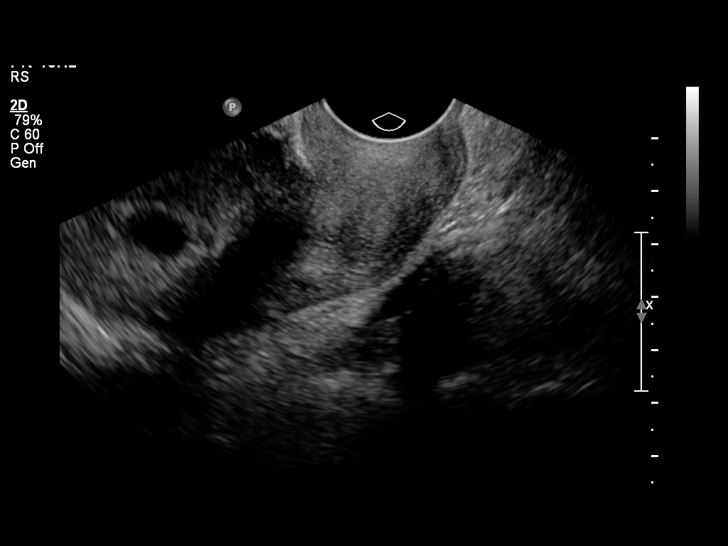

[14 of 28 positions shown; findings below may reference images not displayed]

OBSTETRICS REPORT
                      (Signed Final 07/05/2011 [DATE])

 Order#:         [id number]
Procedures

 US OB TRANSVAGINAL                                    76817.0
Indications

 Pain - Abdominal/Pelvic
Fetal Evaluation

 Preg. Location:    Intrauterine
 Gest. Sac:         Intrauterine
 Yolk Sac:          Visualized
 Fetal Pole:        Visualized
 Fetal Heart Rate:  110                          bpm
 Cardiac Activity:  Observed
Biometry

 CRL:        3  mm     G. Age:  5w 6d                  EDD:    02/29/12
Gestational Age

 LMP:           6w 4d         Date:  05/20/11                 EDD:   02/24/12
 Best:          6w 4d      Det. By:  LMP  (05/20/11)          EDD:   02/24/12
Cervix Uterus Adnexa

 Cervix:       Normal appearance by transvaginal scan
 Cul De Sac:   No free fluid seen.
 Left Ovary:    Within normal limits measuring 2.6 x 3.4x 2.0 cm.
 Right Ovary:   Within normal limits measuring 2.5 x 1.2 x 1.9 cm.

 Adnexa:     No abnormality visualized.
Impression

 There is a single living intrauterine pregancy demonstrating
 an EGA by CRL of  5w 6d. This correlates well with expected
 EGA by of 6w 4d  . Normal ovaries.

## 2013-02-08 ENCOUNTER — Emergency Department (HOSPITAL_COMMUNITY)
Admission: EM | Admit: 2013-02-08 | Discharge: 2013-02-08 | Disposition: A | Discharge status: Home / Self Care | Payer: 59 | Attending: Emergency Medicine | Admitting: Emergency Medicine

## 2013-02-08 ENCOUNTER — Encounter (HOSPITAL_COMMUNITY): Payer: Self-pay

## 2013-02-08 DIAGNOSIS — H5789 Other specified disorders of eye and adnexa: Secondary | ICD-10-CM | POA: Insufficient documentation

## 2013-02-08 DIAGNOSIS — H53149 Visual discomfort, unspecified: Secondary | ICD-10-CM | POA: Insufficient documentation

## 2013-02-08 DIAGNOSIS — Z8742 Personal history of other diseases of the female genital tract: Secondary | ICD-10-CM | POA: Insufficient documentation

## 2013-02-08 DIAGNOSIS — Z8639 Personal history of other endocrine, nutritional and metabolic disease: Secondary | ICD-10-CM | POA: Insufficient documentation

## 2013-02-08 DIAGNOSIS — Z8619 Personal history of other infectious and parasitic diseases: Secondary | ICD-10-CM | POA: Insufficient documentation

## 2013-02-08 DIAGNOSIS — H109 Unspecified conjunctivitis: Secondary | ICD-10-CM

## 2013-02-08 DIAGNOSIS — Z862 Personal history of diseases of the blood and blood-forming organs and certain disorders involving the immune mechanism: Secondary | ICD-10-CM | POA: Insufficient documentation

## 2013-02-08 DIAGNOSIS — Z87891 Personal history of nicotine dependence: Secondary | ICD-10-CM | POA: Insufficient documentation

## 2013-02-08 MED ORDER — GENTAMICIN SULFATE 0.3 % OP OINT: TOPICAL_OINTMENT | Freq: Three times a day (TID) | OPHTHALMIC | Status: DC

## 2013-02-08 NOTE — ED Notes (Signed)
LT eye redness since Monday of this week.  Denies pain over 1/10 or drainage but states when she wakes up her eye is "shut from the goop"  Has been using OTC "redness removal" drops x 1 day but stopped using it Tuesday.

## 2013-02-08 NOTE — ED Provider Notes (Signed)
History     CSN: [id number]  Arrival date & time 02/08/13  1154   First MD Initiated Contact with Patient 02/08/13 1454      Chief Complaint  Patient presents with  . Eye Problem    (Consider location/radiation/quality/duration/timing/severity/associated sxs/prior treatment) Patient is a 22 y.o. female presenting with eye problem. The history is provided by the patient and a friend. No language interpreter was used.  Eye Problem Location:  L eye Quality:  Tearing and foreign body sensation Severity:  Moderate Onset quality:  Gradual Duration:  4 days Timing:  Constant Progression:  Worsening Chronicity:  New Context: contact lenses   Relieved by:  Nothing Worsened by:  Nothing tried Associated symptoms: crusting, discharge, itching, photophobia and redness   Associated symptoms: no blurred vision, no decreased vision, no double vision, no nausea and no vomiting   22 year old female with a 26 month old child c/o of left eye injected, itching burning x 4 days. States that she has woke up the last 2 mornings with crust at her station over her eye. Patient does wear contacts that she has not worn them  this week. She states there is no pain with movement of the eye itself. No vision problems. She has used over the counter visine with no improvement. Nothing makes the condition worse.   Past Medical History  Diagnosis Date  . H/O cystitis   . H/O chlamydia infection 2009  . H/O gonorrhea 2012   . Trichomonas 2009  . History of being obese     Past Surgical History  Procedure Laterality Date  . Wisdom tooth extraction      Family History  Problem Relation Age of Onset  . Anesthesia problems Neg Hx   . Depression Mother   . Hypertension Mother   . Hypertension Father   . Diabetes Maternal Aunt   . Cancer Paternal Aunt   . Diabetes Maternal Grandmother     History  Substance Use Topics  . Smoking status: Former Games developer  . Smokeless tobacco: Never Used  . Alcohol  Use: No    OB History   Grav Para Term Preterm Abortions TAB SAB Ect Mult Living   1 1 1  0 0 0 0 0 0 1      Review of Systems  Constitutional: Negative.  Negative for fever.  HENT: Negative.  Negative for rhinorrhea and sneezing.   Eyes: Positive for photophobia, discharge, redness and itching. Negative for blurred vision, double vision and visual disturbance.  Respiratory: Negative.  Negative for cough.   Cardiovascular: Negative.   Gastrointestinal: Negative.  Negative for nausea and vomiting.  Neurological: Negative.   Psychiatric/Behavioral: Negative.   All other systems reviewed and are negative.    Allergies  Review of patient's allergies indicates no known allergies.  Home Medications   Current Outpatient Rx  Name  Route  Sig  Dispense  Refill  . etonogestrel (IMPLANON) 68 MG IMPL implant   Subcutaneous   Inject 1 each (68 mg total) into the skin once.   1 each   0     BP 116/76  Pulse 78  Temp(Src) 98.6 F (37 C) (Oral)  Resp 16  Ht 5\' 4"  (1.626 m)  SpO2 99%  LMP 01/31/2013  Breastfeeding? No  Physical Exam  Nursing note and vitals reviewed. Constitutional: She is oriented to person, place, and time. She appears well-developed and well-nourished.  HENT:  Head: Normocephalic and atraumatic.  Eyes: EOM are normal. Pupils are equal, round,  and reactive to light. No foreign bodies found. Left eye exhibits discharge. Left conjunctiva is injected. Left conjunctiva has no hemorrhage. Left eye exhibits normal extraocular motion and no nystagmus. Left pupil is round and reactive. Pupils are equal.  Neck: Normal range of motion. Neck supple.  Cardiovascular: Normal rate.   Pulmonary/Chest: Effort normal.  Abdominal: Soft. Bowel sounds are normal. She exhibits distension.  Musculoskeletal: Normal range of motion. She exhibits no edema and no tenderness.  Neurological: She is alert and oriented to person, place, and time. She has normal reflexes.  Skin: Skin is  warm and dry.  Psychiatric: She has a normal mood and affect.    ED Course  Procedures (including critical care time)  Labs Reviewed - No data to display No results found.   No diagnosis found.    MDM  Bacterial conjunctivitis. Rx for erythromycin ointment. She will followup with her eye doctor on Monday.         Remi Haggard, NP 02/09/13 1108

## 2013-02-11 NOTE — ED Provider Notes (Signed)
Medical screening examination/treatment/procedure(s) were performed by non-physician practitioner and as supervising physician I was immediately available for consultation/collaboration.  Zayne Marovich T Sho Salguero, MD 02/11/13 1525 

## 2013-11-14 ENCOUNTER — Emergency Department (HOSPITAL_COMMUNITY)
Admission: EM | Admit: 2013-11-14 | Discharge: 2013-11-14 | Disposition: A | Discharge status: Home / Self Care | Payer: 59 | Attending: Emergency Medicine | Admitting: Emergency Medicine

## 2013-11-14 ENCOUNTER — Encounter (HOSPITAL_COMMUNITY): Payer: Self-pay | Admitting: Emergency Medicine

## 2013-11-14 DIAGNOSIS — Z7251 High risk heterosexual behavior: Secondary | ICD-10-CM

## 2013-11-14 DIAGNOSIS — Z87898 Personal history of other specified conditions: Secondary | ICD-10-CM | POA: Insufficient documentation

## 2013-11-14 DIAGNOSIS — Z3202 Encounter for pregnancy test, result negative: Secondary | ICD-10-CM | POA: Insufficient documentation

## 2013-11-14 DIAGNOSIS — Z87448 Personal history of other diseases of urinary system: Secondary | ICD-10-CM | POA: Insufficient documentation

## 2013-11-14 DIAGNOSIS — Z8619 Personal history of other infectious and parasitic diseases: Secondary | ICD-10-CM | POA: Insufficient documentation

## 2013-11-14 DIAGNOSIS — N898 Other specified noninflammatory disorders of vagina: Secondary | ICD-10-CM | POA: Insufficient documentation

## 2013-11-14 DIAGNOSIS — F17200 Nicotine dependence, unspecified, uncomplicated: Secondary | ICD-10-CM | POA: Insufficient documentation

## 2013-11-14 LAB — URINALYSIS, ROUTINE W REFLEX MICROSCOPIC
Hgb urine dipstick: NEGATIVE
Nitrite: NEGATIVE
Protein, ur: NEGATIVE mg/dL
Specific Gravity, Urine: 1.033 — ABNORMAL HIGH (ref 1.005–1.030)
Urobilinogen, UA: 1 mg/dL (ref 0.0–1.0)

## 2013-11-14 LAB — PREGNANCY, URINE: Preg Test, Ur: NEGATIVE

## 2013-11-14 LAB — WET PREP, GENITAL

## 2013-11-14 LAB — URINE MICROSCOPIC-ADD ON

## 2013-11-14 MED ORDER — LIDOCAINE HCL (PF) 1 % IJ SOLN
INTRAMUSCULAR | Status: AC
  Administered 2013-11-14: 22:00:00
  Filled 2013-11-14: qty 5

## 2013-11-14 MED ORDER — CEFTRIAXONE SODIUM 250 MG IJ SOLR
250.0000 mg | Freq: Once | INTRAMUSCULAR | Status: AC
  Administered 2013-11-14: 250 mg via INTRAMUSCULAR
  Filled 2013-11-14: qty 250

## 2013-11-14 MED ORDER — AZITHROMYCIN 250 MG PO TABS
1000.0000 mg | ORAL_TABLET | Freq: Once | ORAL | Status: AC
  Administered 2013-11-14: 1000 mg via ORAL
  Filled 2013-11-14: qty 4

## 2013-11-14 NOTE — ED Notes (Signed)
Patient presents with c/o vaginal discharge.  States + foul odor and some color "I thought I was bleeding"  Denies urinary symptoms  + unprotected intercourse

## 2013-11-14 NOTE — ED Notes (Signed)
Urine obtained and sent to lab  

## 2013-11-14 NOTE — ED Provider Notes (Signed)
CSN: [id number]     Arrival date & time 11/14/13  1905 History   First MD Initiated Contact with Patient 11/14/13 2012     Chief Complaint  Patient presents with  . Vaginal Discharge   (Consider location/radiation/quality/duration/timing/severity/associated sxs/prior Treatment) HPI Comments: Patient is a 22 year old female who presents for vaginal discharge that has been gradually worsening since onset 3 days ago. Patient states the discharge is malodorous and without modifying factors. Patient versus a history of unprotected sexual intercourse with one partner as well as a history of gonorrhea and chlamydia. She denies associated fever, N/V, abdominal pain, urinary symptoms, vaginal bleeding, and numbness/tingling. LMP 10/21/13.  Patient is a 22 y.o. female presenting with vaginal discharge. The history is provided by the patient. No language interpreter was used.  Vaginal Discharge Associated symptoms: no dysuria     Past Medical History  Diagnosis Date  . H/O cystitis   . H/O chlamydia infection 2009  . H/O gonorrhea 2012   . Trichomonas 2009  . History of being obese    Past Surgical History  Procedure Laterality Date  . Wisdom tooth extraction     Family History  Problem Relation Age of Onset  . Anesthesia problems Neg Hx   . Depression Mother   . Hypertension Mother   . Hypertension Father   . Diabetes Maternal Aunt   . Cancer Paternal Aunt   . Diabetes Maternal Grandmother    History  Substance Use Topics  . Smoking status: Current Every Day Smoker  . Smokeless tobacco: Never Used  . Alcohol Use: Yes     Comment: ocassionally   OB History   Grav Para Term Preterm Abortions TAB SAB Ect Mult Living   1 1 1  0 0 0 0 0 0 1     Review of Systems  Genitourinary: Positive for vaginal discharge. Negative for dysuria, hematuria and vaginal bleeding.  All other systems reviewed and are negative.    Allergies  Review of patient's allergies indicates no known  allergies.  Home Medications  No current outpatient prescriptions on file. BP 114/79  Pulse 79  Temp(Src) 99 F (37.2 C) (Oral)  Resp 16  Ht 5\' 4"  (1.626 m)  Wt 245 lb 3.2 oz (111.222 kg)  BMI 42.07 kg/m2  SpO2 99%  LMP 10/21/2013  Breastfeeding? No  Physical Exam  Nursing note and vitals reviewed. Constitutional: She is oriented to person, place, and time. She appears well-developed and well-nourished. No distress.  HENT:  Head: Normocephalic and atraumatic.  Eyes: Conjunctivae and EOM are normal. No scleral icterus.  Neck: Normal range of motion.  Cardiovascular: Normal rate, regular rhythm and intact distal pulses.   Pulmonary/Chest: Effort normal. No respiratory distress.  Abdominal: Soft. There is no tenderness. There is no rebound and no guarding.  Genitourinary: There is no rash, tenderness, lesion or injury on the right labia. There is no rash, tenderness, lesion or injury on the left labia. Uterus is not tender. Cervix exhibits friability (mild around os). Cervix exhibits no motion tenderness and no discharge. Right adnexum displays no mass, no tenderness and no fullness. Left adnexum displays no mass, no tenderness and no fullness. No erythema, tenderness or bleeding around the vagina. No foreign body around the vagina. No signs of injury around the vagina. Vaginal discharge (white milky) found.  Musculoskeletal: Normal range of motion.  Neurological: She is alert and oriented to person, place, and time.  Skin: Skin is warm and dry. No rash noted. She is not  diaphoretic. No erythema. No pallor.  Psychiatric: She has a normal mood and affect. Her behavior is normal.    ED Course  Procedures (including critical care time) Labs Review Labs Reviewed  WET PREP, GENITAL - Abnormal; Notable for the following:    Clue Cells Wet Prep HPF POC FEW (*)    WBC, Wet Prep HPF POC FEW (*)    All other components within normal limits  URINALYSIS, ROUTINE W REFLEX MICROSCOPIC -  Abnormal; Notable for the following:    APPearance CLOUDY (*)    Specific Gravity, Urine 1.033 (*)    Leukocytes, UA SMALL (*)    All other components within normal limits  URINE MICROSCOPIC-ADD ON - Abnormal; Notable for the following:    Squamous Epithelial / LPF MANY (*)    Bacteria, UA FEW (*)    All other components within normal limits  URINE CULTURE  GC/CHLAMYDIA PROBE AMP  PREGNANCY, URINE   Imaging Review No results found.  EKG Interpretation   None       MDM   1. History of unprotected sex   2. Vaginal discharge    Patient presents for vaginal d/c x 3 days with a hx of unprotected sexual intercourse as well as GC/Chlamydia and trichomonas. Patient well and nontoxic appearing, hemodynamically stable, and afebrile. Physical exam with vaginal d/c that is milky in appearance. No adnexal tenderness, CMT, or TTP on abdominal exam. UA nonsuggestive of infection; more c/w contamination. Wet prep without trichomonas. GC/Chlamydia pending. Patient will be tx for GC/Chlamydia today given hx of unprotected sexual intercourse and vaginal c/o. She is stable for d/c with OBGYN follow up and instruction to not engage in sexual intercourse until her partner is tested and tx for STDs. Return precautions discussed and patient agreeable to plan with no unaddressed concerns.    Antony Madura, PA-C 11/14/13 2218

## 2013-11-15 LAB — GC/CHLAMYDIA PROBE AMP
CT Probe RNA: NEGATIVE
GC Probe RNA: NEGATIVE

## 2013-11-15 NOTE — ED Provider Notes (Signed)
Medical screening examination/treatment/procedure(s) were performed by non-physician practitioner and as supervising physician I was immediately available for consultation/collaboration.  EKG Interpretation   None        Zarinah Oviatt T Stran Raper, MD 11/15/13 1540 

## 2013-11-16 LAB — URINE CULTURE: Culture: NO GROWTH

## 2014-03-13 ENCOUNTER — Emergency Department (HOSPITAL_COMMUNITY)
Admission: EM | Admit: 2014-03-13 | Discharge: 2014-03-13 | Disposition: A | Discharge status: Home / Self Care | Payer: 59 | Attending: Emergency Medicine | Admitting: Emergency Medicine

## 2014-03-13 ENCOUNTER — Encounter (HOSPITAL_COMMUNITY): Payer: Self-pay | Admitting: Emergency Medicine

## 2014-03-13 DIAGNOSIS — F17200 Nicotine dependence, unspecified, uncomplicated: Secondary | ICD-10-CM | POA: Insufficient documentation

## 2014-03-13 DIAGNOSIS — Z87440 Personal history of urinary (tract) infections: Secondary | ICD-10-CM | POA: Insufficient documentation

## 2014-03-13 DIAGNOSIS — Z8619 Personal history of other infectious and parasitic diseases: Secondary | ICD-10-CM | POA: Insufficient documentation

## 2014-03-13 DIAGNOSIS — Z87898 Personal history of other specified conditions: Secondary | ICD-10-CM | POA: Insufficient documentation

## 2014-03-13 DIAGNOSIS — Z3202 Encounter for pregnancy test, result negative: Secondary | ICD-10-CM | POA: Insufficient documentation

## 2014-03-13 NOTE — ED Notes (Signed)
Pt states she took an at home test today and it was not very clear if the results were positive or negative

## 2014-03-13 NOTE — ED Provider Notes (Signed)
CSN: [id number]     Arrival date & time 03/13/14  2215 History  This chart was scribed for non-physician practitioner Dierdre ForthHannah Geniene List, PA-C working with Gavin PoundMichael Y. Oletta LamasGhim, MD by Donne Anonayla Curran, ED Scribe. This patient was seen in room TR06C/TR06C and the patient's care was started at 2244.   First MD Initiated Contact with Patient 03/13/14 2244     Chief Complaint  Patient presents with  . Possible Pregnancy    The history is provided by the patient and medical records. No language interpreter was used.   HPI Comments: Jodi Mcdonald is a 23 y.o. female G1P1who presents to the Emergency Department complaining of possible pregnancy. She states she took an at home test 2 days ago and the results were unclear. Her LMP was February 27th. She is sexually active and does not use birth control. She has been pregnant once before. She denies abdominal pain, nausea, vomiting, vaginal bleeding, vaginal discharge or any other symptoms. She reports she is otherwise healthy.    Past Medical History  Diagnosis Date  . H/O cystitis   . H/O chlamydia infection 2009  . H/O gonorrhea 2012   . Trichomonas 2009  . History of being obese    Past Surgical History  Procedure Laterality Date  . Wisdom tooth extraction     Family History  Problem Relation Age of Onset  . Anesthesia problems Neg Hx   . Depression Mother   . Hypertension Mother   . Hypertension Father   . Diabetes Maternal Aunt   . Cancer Paternal Aunt   . Diabetes Maternal Grandmother    History  Substance Use Topics  . Smoking status: Current Every Day Smoker    Types: Cigarettes  . Smokeless tobacco: Never Used  . Alcohol Use: Yes     Comment: ocassionally   OB History   Grav Para Term Preterm Abortions TAB SAB Ect Mult Living   1 1 1  0 0 0 0 0 0 1     Review of Systems  Constitutional: Negative for fever and chills.  Gastrointestinal: Negative for nausea, vomiting and abdominal pain.  Genitourinary: Positive for  menstrual problem. Negative for vaginal bleeding and vaginal discharge.      Allergies  Review of patient's allergies indicates no known allergies.  Home Medications  No current outpatient prescriptions on file.  BP 132/77  Pulse 88  Temp(Src) 98.2 F (36.8 C) (Oral)  Resp 18  Ht 5\' 4"  (1.626 m)  Wt 254 lb 3.2 oz (115.304 kg)  BMI 43.61 kg/m2  SpO2 100%  LMP 02/22/2014  Physical Exam  Nursing note and vitals reviewed. Constitutional: She appears well-developed and well-nourished. No distress.  Awake, alert, nontoxic appearance  HENT:  Head: Normocephalic and atraumatic.  Mouth/Throat: Oropharynx is clear and moist. No oropharyngeal exudate.  Eyes: Conjunctivae are normal. No scleral icterus.  Neck: Normal range of motion. Neck supple.  Cardiovascular: Normal rate, regular rhythm, normal heart sounds and intact distal pulses.   No murmur heard. Pulmonary/Chest: Effort normal and breath sounds normal. No respiratory distress. She has no wheezes.  Abdominal: Soft. Bowel sounds are normal. She exhibits no mass. There is no tenderness. There is no rebound and no guarding.  Musculoskeletal: Normal range of motion. She exhibits no edema.  Neurological: She is alert.  Speech is clear and goal oriented Moves extremities without ataxia  Skin: Skin is warm and dry. She is not diaphoretic. No erythema.  Psychiatric: She has a normal mood and affect.  ED Course  Procedures (including critical care time) DIAGNOSTIC STUDIES: Oxygen Saturation is 100% on RA, normal by my interpretation.    COORDINATION OF CARE: 10:55 PM Discussed treatment plan with pt at bedside and pt agreed to plan. Discussed lab results.    Labs Review Labs Reviewed  POC URINE PREG, ED   Imaging Review No results found.   EKG Interpretation None      MDM   Final diagnoses:  Pregnancy test negative   Emelyn A Korff presents for pregnancy test. She has not yet missed her menses.  Pt's  pregnancy test is negative.  Discussed the importance of healthy sexual habits, the use of birth control and routine follow-up with GYN.    It has been determined that no acute conditions requiring further emergency intervention are present at this time. The patient/guardian have been advised of the diagnosis and plan. We have discussed signs and symptoms that warrant return to the ED, such as changes or worsening in symptoms.   Vital signs are stable at discharge.   BP 132/77  Pulse 88  Temp(Src) 98.2 F (36.8 C) (Oral)  Resp 18  Ht 5\' 4"  (1.626 m)  Wt 254 lb 3.2 oz (115.304 kg)  BMI 43.61 kg/m2  SpO2 100%  LMP 02/22/2014  Patient/guardian has voiced understanding and agreed to follow-up with the PCP or specialist.    I personally performed the services described in this documentation, which was scribed in my presence. The recorded information has been reviewed and is accurate.   Dahlia Client Shelita Steptoe, PA-C 03/13/14 2309

## 2014-03-13 NOTE — Discharge Instructions (Signed)
1. Medications: usual home medications °2. Treatment: rest, drink plenty of fluids,  °3. Follow Up: Please followup with your primary doctor for discussion of your diagnoses and further evaluation after today's visit; if you do not have a primary care doctor use the resource guide provided to find one;  ° ° °Emergency Department Resource Guide °1) Find a Doctor and Pay Out of Pocket °Although you won't have to find out who is covered by your insurance plan, it is a good idea to ask around and get recommendations. You will then need to call the office and see if the doctor you have chosen will accept you as a new patient and what types of options they offer for patients who are self-pay. Some doctors offer discounts or will set up payment plans for their patients who do not have insurance, but you will need to ask so you aren't surprised when you get to your appointment. ° °2) Contact Your Local Health Department °Not all health departments have doctors that can see patients for sick visits, but many do, so it is worth a call to see if yours does. If you don't know where your local health department is, you can check in your phone book. The CDC also has a tool to help you locate your state's health department, and many state websites also have listings of all of their local health departments. ° °3) Find a Walk-in Clinic °If your illness is not likely to be very severe or complicated, you may want to try a walk in clinic. These are popping up all over the country in pharmacies, drugstores, and shopping centers. They're usually staffed by nurse practitioners or physician assistants that have been trained to treat common illnesses and complaints. They're usually fairly quick and inexpensive. However, if you have serious medical issues or chronic medical problems, these are probably not your best option. ° °No Primary Care Doctor: °- Call [facility] at  832-8000 - they can help you locate a primary care doctor that   accepts your insurance, provides certain services, etc. °- Physician Referral Service- 1-800-533-3463 ° °Chronic Pain Problems: °Organization         Address  Phone   Notes  °[facility]  (336) 297-2271 Patients need to be referred by their primary care doctor.  ° °Medication Assistance: °Organization         Address  Phone   Notes  °[place] [facility] [address] °[place], [place] [address] (336) 641-8030 --Must be a resident of [place] °-- Must have NO insurance coverage whatsoever (no Medicaid/ Medicare, etc.) °-- The pt. MUST have a primary care doctor that directs their care regularly and follows them in the community °  °[facility]  (866) 331-1348   °[employer]  (888) 892-1162   ° °Agencies that provide inexpensive medical care: °Organization         Address  Phone   Notes  °[facility]  (336) 832-8035   °[facility]    (336) 832-7272   °[facility] [address] °[place], [place] [address] (336) 832-4777   °[facility] [address], °[place] (336) 271-4999   °[facility]    (336) 373-0678   °[facility]    (336) 272-1050   °[facility] ° [address], [place] Phone:  (336) 832-4444, Fax:  (336) 832-4440 Hours of Operation:    9 am - 6 pm, M-F.  Also accepts Medicaid/Medicare and self-pay.  °[facility] ° [address], [place] Phone: (336) 832-3150, Fax: (336) 832-3151. Hours of Operation:  8:30 am - 5:30 pm, M-F.  Also accepts Medicaid and self-pay.  °[facility] [place] [address], [place] Phone: (336) 878-6027   °[facility] [address], [place], [place] (336)723-1848, Ext. 123 Mondays & Thursdays: 7-9 AM.  First 15 patients are seen on a first come, first serve basis. °  ° °Medicaid-accepting [place] Providers: ° °Organization          Address  Phone   Notes  °[facility] [address], [place] (336) 641-2100 Also accepts self-pay patients.  °[facility] [address], [place] ° (336) 856-9996   °[facility] [address], [place] (336) 288-8857   °[facility] [address] [place] [address], [place] (336) 299-7000   °Veita Bland [address], [place]  ° (336) 373-1557 Only accepts [place] Access Medicaid patients after they have their name applied to their card.  ° °Self-Pay (no insurance) in [place]: ° °Organization         Address  Phone   Notes  °Sickle Cell Patients, [facility] [address], [place] (336) 832-1970   °[facility] [address], [place] (336) 832-4400   °[facility] ° [address], [place] (336) 992-4800   °[facility]/Dr. Osei-Bonsu ° [address] [place] [address], [place] or [address], [place] (336) 841-8500 Phone number for both [place] and [place] locations is the same.  °[facility] [address], [place] (336) 299-0000   °[facility] [address] [place] [address], [place] or [address] (336) 852-7530 °(336) 878-2260   °[facility] [address], [place] (336) 350-1642, phone; (336) 294-5005, fax Sees patients 1st and 3rd Saturday of every month.  Must not qualify for public or private insurance (i.e. Medicaid, Medicare, [place] Health Choice, Veterans' Benefits) • Household income should be no more than 200% of the poverty level •The clinic cannot treat you if you are pregnant or think you are pregnant • Sexually transmitted diseases are not treated at the clinic.  ° ° °Dental Care: °Organization         Address  Phone  Notes  °[facility] [address],   (336) 641-6152 Accepts children up to age 21 who are enrolled in Medicaid or [place] Health Choice; pregnant women with a Medicaid card; and children who have applied for Medicaid or [place] Health Choice, but were declined, whose parents can pay a reduced fee at time of service.  °[facility] [place]  [address], [place] (336) 641-7733 Accepts children up to age 21 who are enrolled in Medicaid or [place] Health Choice; pregnant women with a Medicaid card; and children who have applied for Medicaid or [place] Health Choice, but were declined, whose parents can pay a reduced fee at time of service.  °[facility] ° [address],  (336) 641-4533 Patients are seen by appointment only. Walk-ins are not accepted. [facility] will see patients 18 years   of age and older. °Monday - Tuesday (8am-5pm) °Most Wednesdays (8:30-5pm) °$30 per visit, cash only  °[facility] ° [address], [place] (336) 641-4533 Patients are seen by appointment only. Walk-ins are not accepted. [facility] will see patients 18 years of age and older. °One Wednesday Evening (Monthly: Volunteer Based).  $30 per visit, cash only  °[facility]  (919) 537-3737 for adults; Children under age 4, call Graduate Pediatric Dentistry at (919) [phone]. Children aged 4-14, please call (919) [phone] to request a pediatric application. ° Dental services are provided in all areas of dental care including fillings, crowns and bridges, complete and partial dentures, implants, gum treatment, root canals, and extractions. Preventive care is also provided. Treatment is provided to both adults and children. °Patients are selected via a lottery and there is often a waiting list. °  °[facility] [address], °[place] ° (336) 763-8833 [email or web address] °  °[facility] [address], [place], [place]  (336)723-1848, Ext. 123 Second and Fourth Thursday of each month, opens at 6:30 AM; Clinic ends at 9 AM.  Patients are seen on a first-come first-served basis, and a limited number are seen during each clinic.  ° °[facility] ° [address], [place], [place] (336) 723-7904   Eligibility Requirements °You must have lived in [place], [place], or [place] counties for at least the last three months. °  You cannot be eligible for state or federal sponsored healthcare insurance, including Veterans Administration, Medicaid, or Medicare. °  You generally cannot be eligible for healthcare insurance through your employer.  °  How to apply: °Eligibility screenings are held every Tuesday and Wednesday afternoon from 1:00 pm until 4:00 pm. You do not need an appointment for the interview!  °[facility] [address], [place], [place] 336-631-2330   °[facility]  336-342-8273   °[facility]  336-703-3100   °[facility]  336-570-6415   ° °Behavioral Health Resources in the Community: °Intensive Outpatient Programs °Organization         Address  Phone  Notes  °[facility] [address], [place], [place] 336-878-6098   °[facility] [address], [place], [place] 336-832-9800   °[facility] [address], [place], [place] ° 336-882-2125   °[facility] [address],  °[place], [place] 1-800-853-5163 or 336-641-4981   °Substance Abuse Resources °Organization         Address  Phone  Notes  °[facility]  336-882-2125   °[facility]  336-784-9470   °[facility]  336-285-9073   °[facility]  336-845-3988   °Residential & Outpatient Substance Abuse Program  1-800-659-3381   °Psychological Services °Organization         Address  Phone  Notes  °[facility]  336- 832-9600   °[facility]  336- 378-7881    °[facility] [address], [place] 1-800-853-5163 or 336-641-4981   ° °Mobile Crisis Teams °Organization         Address  Phone  Notes  °[facility], Mobile Crisis Care Unit  1-877-626-1772   °Assertive °[facility] ° [address] [place], [place] 336-834-9664   °Sharon DeEsch [address] °[place]  336-554-5454   ° °Self-Help/Support Groups °Organization           Address  Phone             Notes  °[facility] [place] - variety of support groups  336- 373-1402 Call for more information  °Narcotics Anonymous (NA), [facility] [address], °[place]  2 meetings at this location  ° °Residential Treatment Programs °Organization         Address  Phone  Notes  °[facility] [address],    °[place]  1-866-801-8205   °[facility] ° [address], [place], [place] 704-293-8524   °[facility] [address], [place] 336-845-3988 Admissions: 8am-3pm M-F  °[facility] [address]    °[place], [place] 336-841-1104   °[facility] [address], [place], [place] 336-379-7146   °[facility] [address]  °[place], [place] 336-285-9073   °[facility] Intensive Outpatient [address], [place], [place] 336-852-3033   °[facility] [address]  °[place], [place] 1-877-615-2722 or 336-784-9470   °[facility] [address] [place], [place] 336-227-7417 Accepts Medicaid  °[facility] [address]  ° [place] 1-800-659-3381 Substance Abuse/Addiction Treatment  ° °[place] Behavioral Health Resources °Organization         Address  Phone  Notes  °[facility]  (888) 581-9988   °Julie Brannon, PhD [address] [place], [place]   (336) 349-5553 or (336) 951-0000   °[facility]   [address] °[place], [place] (336) 349-4454   °[facility] [address], [place], [place] (336) 342-8316 Insurance/Medicaid/sponsorship through [facility]  °[facility] [address]                                    [place], [place] (336) 342-8316 Therapy/tele-psych/case  °[facility] [address]  ° [place], [place] (336) 349-2233    °Dr. Arfeen  (336) 349-4544   °[facility] [place]  [employer] [place] [facility] 1) [address], [place] °2) [address], [place] °3)  [address], [place] (336) 349-3220 °(336) 342-7768 ° °(336) 342-8140   °[place] [facility] (336) 342-1394 or (336) 342-3537 (After Hours)    ° ° ° ° °

## 2014-03-13 NOTE — ED Notes (Signed)
Poc pregnancy test is Negative

## 2014-03-14 LAB — POC URINE PREG, ED: Preg Test, Ur: NEGATIVE

## 2014-03-17 NOTE — ED Provider Notes (Signed)
Medical screening examination/treatment/procedure(s) were performed by non-physician practitioner and as supervising physician I was immediately available for consultation/collaboration.  Marely Apgar Y. Venissa Nappi, MD 03/17/14 0916 

## 2014-03-31 ENCOUNTER — Emergency Department (HOSPITAL_COMMUNITY)
Admission: EM | Admit: 2014-03-31 | Discharge: 2014-03-31 | Discharge status: Against Medical Advice | Payer: 59 | Attending: Emergency Medicine | Admitting: Emergency Medicine

## 2014-03-31 ENCOUNTER — Encounter (HOSPITAL_COMMUNITY): Payer: Self-pay | Admitting: Emergency Medicine

## 2014-03-31 DIAGNOSIS — K089 Disorder of teeth and supporting structures, unspecified: Secondary | ICD-10-CM | POA: Insufficient documentation

## 2014-03-31 NOTE — ED Notes (Signed)
The pt has had a toothache for 4-5 weeks

## 2014-05-27 ENCOUNTER — Inpatient Hospital Stay (HOSPITAL_COMMUNITY)
Admission: AD | Admit: 2014-05-27 | Discharge: 2014-05-27 | Disposition: A | Discharge status: Home / Self Care | Payer: 59 | Source: Ambulatory Visit | Attending: Obstetrics & Gynecology | Admitting: Obstetrics & Gynecology

## 2014-05-27 ENCOUNTER — Encounter (HOSPITAL_COMMUNITY): Payer: Self-pay | Admitting: *Deleted

## 2014-05-27 DIAGNOSIS — Z3490 Encounter for supervision of normal pregnancy, unspecified, unspecified trimester: Secondary | ICD-10-CM

## 2014-05-27 DIAGNOSIS — Z3201 Encounter for pregnancy test, result positive: Secondary | ICD-10-CM | POA: Insufficient documentation

## 2014-05-27 HISTORY — DX: Unspecified infectious disease: B99.9

## 2014-05-27 LAB — POCT PREGNANCY, URINE: Preg Test, Ur: POSITIVE — AB

## 2014-05-27 NOTE — MAU Note (Signed)
+  HPT last wk. Wanting to know how far along she is is.

## 2014-05-27 NOTE — MAU Provider Note (Signed)
Jodi Mcdonald is a 23 y.o. G2P1001 @ [redacted]w[redacted]d gestation here for pregnancy verification. She denies any problems.   BP 118/72  Pulse 83  Temp(Src) 99.1 F (37.3 C) (Oral)  Resp 18  Ht 5\' 4"  (1.626 m)  Wt 254 lb (115.214 kg)  BMI 43.58 kg/m2  LMP 03/30/2014   Results for orders placed during the hospital encounter of 05/27/14 (from the past 24 hour(s))  POCT PREGNANCY, URINE     Status: Abnormal   Collection Time    05/27/14  6:28 PM      Result Value Ref Range   Preg Test, Ur POSITIVE (*) NEGATIVE    Pregnancy verification letter given to the patient. She will call the health department to schedule prenatal visit. She will return here as needed for any problems. Stable for discharge without pain or bleeding.

## 2014-06-10 ENCOUNTER — Inpatient Hospital Stay (HOSPITAL_COMMUNITY)
Admission: AD | Admit: 2014-06-10 | Discharge: 2014-06-11 | Disposition: A | Discharge status: Home / Self Care | Payer: 59 | Source: Ambulatory Visit | Attending: Obstetrics & Gynecology | Admitting: Obstetrics & Gynecology

## 2014-06-10 DIAGNOSIS — Z87891 Personal history of nicotine dependence: Secondary | ICD-10-CM | POA: Insufficient documentation

## 2014-06-10 DIAGNOSIS — O034 Incomplete spontaneous abortion without complication: Secondary | ICD-10-CM | POA: Insufficient documentation

## 2014-06-11 ENCOUNTER — Inpatient Hospital Stay (HOSPITAL_COMMUNITY): Payer: 59

## 2014-06-11 ENCOUNTER — Telehealth: Payer: Self-pay

## 2014-06-11 ENCOUNTER — Encounter (HOSPITAL_COMMUNITY): Payer: Self-pay | Admitting: *Deleted

## 2014-06-11 DIAGNOSIS — O034 Incomplete spontaneous abortion without complication: Secondary | ICD-10-CM

## 2014-06-11 DIAGNOSIS — A749 Chlamydial infection, unspecified: Secondary | ICD-10-CM

## 2014-06-11 LAB — HCG, QUANTITATIVE, PREGNANCY: hCG, Beta Chain, Quant, S: 4951 m[IU]/mL — ABNORMAL HIGH (ref <5)

## 2014-06-11 LAB — CBC
HCT: 36.8 % (ref 36.0–46.0)
Hemoglobin: 12.5 g/dL (ref 12.0–15.0)
MCH: 29.1 pg (ref 26.0–34.0)
MCHC: 34 g/dL (ref 30.0–36.0)
MCV: 85.8 fL (ref 78.0–100.0)
PLATELETS: 337 10*3/uL (ref 150–400)
RBC: 4.29 MIL/uL (ref 3.87–5.11)
RDW: 13.6 % (ref 11.5–15.5)
WBC: 10.1 10*3/uL (ref 4.0–10.5)

## 2014-06-11 LAB — GC/CHLAMYDIA PROBE AMP
CT Probe RNA: POSITIVE — AB
GC PROBE AMP APTIMA: NEGATIVE

## 2014-06-11 LAB — WET PREP, GENITAL
Clue Cells Wet Prep HPF POC: NONE SEEN
Trich, Wet Prep: NONE SEEN
WBC WET PREP: NONE SEEN
Yeast Wet Prep HPF POC: NONE SEEN

## 2014-06-11 MED ORDER — IBUPROFEN 600 MG PO TABS: 600.0000 mg | ORAL_TABLET | Freq: Four times a day (QID) | ORAL | Status: DC | PRN

## 2014-06-11 MED ORDER — AZITHROMYCIN 500 MG PO TABS: 1000.0000 mg | ORAL_TABLET | Freq: Once | ORAL | Status: DC

## 2014-06-11 NOTE — Discharge Instructions (Signed)
Incomplete Miscarriage °A miscarriage is the sudden loss of an unborn baby (fetus) before the 20th week of pregnancy. In an incomplete miscarriage, parts of the fetus or placenta (afterbirth) remain in the body.  °Having a miscarriage can be an emotional experience. Talk with your health care provider about any questions you may have about miscarrying, the grieving process, and your future pregnancy plans. °CAUSES  °· Problems with the fetal chromosomes that make it impossible for the baby to develop normally. Problems with the baby's genes or chromosomes are most often the result of errors that occur by chance as the embryo divides and grows. The problems are not inherited from the parents. °· Infection of the cervix or uterus. °· Hormone problems. °· Problems with the cervix, such as having an incompetent cervix. This is when the tissue in the cervix is not strong enough to hold the pregnancy. °· Problems with the uterus, such as an abnormally shaped uterus, uterine fibroids, or congenital abnormalities. °· Certain medical conditions. °· Smoking, drinking alcohol, or taking illegal drugs. °· Trauma. °SYMPTOMS  °· Vaginal bleeding or spotting, with or without cramps or pain. °· Pain or cramping in the abdomen or lower back. °· Passing fluid, tissue, or blood clots from the vagina. °DIAGNOSIS  °Your health care provider will perform a physical exam. You may also have an ultrasound to confirm the miscarriage. Blood or urine tests may also be ordered. °TREATMENT  °· Usually, a dilation and curettage (D&C) procedure is performed. During a D&C procedure, the cervix is widened (dilated) and any remaining fetal or placental tissue is gently removed from the uterus. °· Antibiotic medicines are prescribed if there is an infection. Other medicines may be given to reduce the size of the uterus (contract) if there is a lot of bleeding. °· If you have Rh negative blood and your baby was Rh positive, you will need an Rho(D)  immune globulin shot. This shot will protect any future baby from having Rh blood problems in future pregnancies. °· You may be confined to bed rest. This means you should stay in bed and only get up to use the bathroom. °HOME CARE INSTRUCTIONS  °· Rest as directed by your health care provider. °· Restrict activity as directed by your health care provider. You may be allowed to continue light activity if curettage was not done but you require further treatment. °· Keep track of the number of pads you use each day. Keep track of how soaked (saturated) they are. Record this information. °· Do not  use tampons. °· Do not douche or have sexual intercourse until approved by your health care provider. °· Keep all follow-up appointments for re-evaluation and continuing management. °· Only take over-the-counter or prescription medicines for pain, fever, or discomfort as directed by your health care provider. °· Take antibiotic medicine as directed by your health care provider. Make sure you finish it even if you start to feel better. °SEEK IMMEDIATE MEDICAL CARE IF:  °· You experience severe cramps in your stomach, back, or abdomen. °· You have an unexplained temperature (make sure to record these temperatures). °· You pass large clots or tissue (save these for your health care provider to inspect). °· Your bleeding increases. °· You become light-headed, weak, or have fainting episodes. °MAKE SURE YOU:  °· Understand these instructions. °· Will watch your condition. °· Will get help right away if you are not doing well or get worse. °Document Released: 12/12/2005 Document Revised: 10/02/2013 Document Reviewed: 07/11/2013 °  ExitCare Patient Information 2015 [employer] [place]. This information is not intended to replace advice given to you by your health care provider. Make sure you discuss any questions you have with your health care provider.  FACTS YOU SHOULD KNOW  WHAT IS AN EARLY PREGNANCY FAILURE? Once the egg is  fertilized with the sperm and begins to develop, it attaches to the lining of the uterus. This early pregnancy tissue may not develop into an embryo (the beginning stage of a baby). Sometimes an embryo does develop but does not continue to grow. These problems can be seen on ultrasound.   MANAGEMNT OF EARLY PREGNANCY FAILURE: About 4 out of 100 (0.25%) women will have a pregnancy loss in her lifetime.  One in five pregnancies is found to be an early pregnancy failure.  There are 3 ways to care for an early pregnancy failure:   (1) Surgery, (2) Medicine, (3) Waiting for you to pass the pregnancy on your own. The decision as to how to proceed after being diagnosed with and early pregnancy failure is an individual one.  The decision can be made only after appropriate counseling.  You need to weigh the pros and cons of the 3 choices. Then you can make the choice that works for you. SURGERY (D&E)   Procedure over in 1 day   Requires being put to sleep   Bleeding may be light   Possible problems during surgery, including injury to womb(uterus)   Care provider has more control Medicine (CYTOTEC)   The complete procedure may take days to weeks   No Surgery   Bleeding may be heavy at times   There may be drug side effects   Patient has more control Waiting   You may choose to wait, in which case your own body may complete the passing of the abnormal early pregnancy on its own in about 2-4 weeks   Your bleeding may be heavy at times   There is a small possibility that you may need surgery if the bleeding is too much or not all of the pregnancy has passed. CYTOTEC MANAGEMENT Prostaglandins (cytotec) are the most widely used drug for this purpose. They cause the uterus to cramp and contract. You will place the medicine yourself inside your vagina in the privacy of your home. Empting of the uterus should occur within 3 days but the process may continue for several weeks. The bleeding may seem heavy at  times. POSSIBLE SIDE EFFECTS FROM CYTOTEC   Nausea   Vomiting   Diarrhea Fever   Chills  Hot Flashes Side effects  from the process of the early pregnancy failure include:   Cramping  Bleeding   Headaches  Dizziness RISKS: This is a low risk procedure. Less than 1 in 100 women has a complication. An incomplete passage of the early pregnancy may occur. Also, Hemorrhage (heavy bleeding) could happen.  Rarely the pregnancy will not be passed completely. Excessively heavy bleeding may occur.  Your doctor may need to perform surgery to empty the uterus (D&E). Afterwards: Everybody will feel differently after the early pregnancy completion. You may have soreness or cramps for a day or two. You may have soreness or cramps for day or two.  You may have light bleeding for up to 2 weeks. You may be as active as you feel like being. If you have any of the following problems you may call Maternity Admissions Unit at [phone].   If you have pain that does not get  better  with pain medication   Bleeding that soaks through 2 thick full-sized sanitary pads in an hour   Cramps that last longer than 2 days   Foul smelling discharge   Fever above 100.4 degrees F Even if you do not have any of these symptoms, you should have a follow-up exam to make sure you are healing properly. This appointment will be made for you before you leave the hospital. Your next normal period will start again in 4-6 week after the loss. You can get pregnant soon after the loss, so use birth control right away. Finally: Make sure all your questions are answered before during and after any procedure. Follow up with medical care and family planning methods.

## 2014-06-11 NOTE — MAU Note (Signed)
Pt states she started having spotting Saturday night x1hour. Pinkish  When she wiped and then it stopped.  Pt states she started back bleeding Sunday night and bleeding started getting heavy.pt states bleeding is the heaviest it has been now

## 2014-06-11 NOTE — Telephone Encounter (Signed)
Zithromax 1gm PO prescribed. Called patient and informed prescription is at pharmacy. Patient verbalized understanding. No questions or concerns.

## 2014-06-11 NOTE — Telephone Encounter (Signed)
Message copied by Louanna RawAMPBELL, TAYLOR M on Wed Jun 11, 2014  4:06 PM ------      Message from: Pennie BanterSMITH, MARNI W      Created: Wed Jun 11, 2014  3:48 PM       Telephone call to patient regarding positive chlamydia culture, patient notified.  Patient has not been treated and [facility]  Instructed patient to notify her partner for treatment.  Report faxed to health department. ------

## 2014-06-11 NOTE — MAU Provider Note (Signed)
Chief Complaint: Vaginal Bleeding   First Provider Initiated Contact with Patient 06/11/14 0100      SUBJECTIVE HPI: Jodi Mcdonald is a 23 y.o. G2P1001 at 7352w3d by LMP who presents with vaginal bleeding 4 days that started as spotting and has worsened to bright red bleeding as heavy as the beginning of menstrual period. Also reports Passing 3 cm clots and mild cramping. Denies passage of tissue. Only testing this pregnancy is UPT 2 weeks ago.  Has not started prenatal care.   Blood type O positive.  Past Medical History  Diagnosis Date  . H/O cystitis   . H/O chlamydia infection 2009  . H/O gonorrhea 2012   . Trichomonas 2009  . History of being obese   . Infection     UTI   OB History  Gravida Para Term Preterm AB SAB TAB Ectopic Multiple Living  2 1 1  0 0 0 0 0 0 1    # Outcome Date GA Lbr Len/2nd Weight Sex Delivery Anes PTL Lv  2 CUR           1 TRM 03/06/12 6953w6d / 00:21 3.314 kg (7 lb 4.9 oz) M SVD Local  Y     Comments: WNL     Past Surgical History  Procedure Laterality Date  . Wisdom tooth extraction    . No past surgeries     History   Social History  . Marital Status: Single    Spouse Name: N/A    Number of Children: N/A  . Years of Education: N/A   Occupational History  . Not on file.   Social History Main Topics  . Smoking status: Former Smoker    Types: Cigarettes  . Smokeless tobacco: Never Used     Comment: quit recently  . Alcohol Use: Yes     Comment: ocassionally  . Drug Use: No  . Sexual Activity: Yes   Other Topics Concern  . Not on file   Social History Narrative  . No narrative on file   No current facility-administered medications on file prior to encounter.   No current outpatient prescriptions on file prior to encounter.   No Known Allergies  ROS: Pertinent items in HPI  OBJECTIVE Blood pressure 109/68, pulse 81, temperature 98.7 F (37.1 C), temperature source Oral, resp. rate 20, height 5\' 4"  (1.626 m), weight  114.477 kg (252 lb 6 oz), last menstrual period 03/30/2014. GENERAL: Well-developed, well-nourished, obese female in no acute distress.  HEENT: Normocephalic HEART: normal rate RESP: normal effort ABDOMEN: Soft, non-tender. Positive bowel sounds. Negative CVA tenderness. EXTREMITIES: Nontender, no edema NEURO: Alert and oriented SPECULUM EXAM: NEFG, small-moderate amount of bright red blood and 2X4 centimeter clot noted, cervix clean BIMANUAL: cervix fingertip dilated; uterus slightly enlarged, but exam limited by body habitus, no adnexal tenderness or masses. No cervical motion tenderness.  LAB RESULTS Results for orders placed during the hospital encounter of 06/10/14 (from the past 24 hour(s))  WET PREP, GENITAL     Status: None   Collection Time    06/11/14  1:20 AM      Result Value Ref Range   Yeast Wet Prep HPF POC NONE SEEN  NONE SEEN   Trich, Wet Prep NONE SEEN  NONE SEEN   Clue Cells Wet Prep HPF POC NONE SEEN  NONE SEEN   WBC, Wet Prep HPF POC NONE SEEN  NONE SEEN  HCG, QUANTITATIVE, PREGNANCY     Status: Abnormal   Collection Time  06/11/14  1:25 AM      Result Value Ref Range   hCG, Beta Chain, Quant, S 4951 (*) <5 mIU/mL  CBC     Status: None   Collection Time    06/11/14  1:25 AM      Result Value Ref Range   WBC 10.1  4.0 - 10.5 K/uL   RBC 4.29  3.87 - 5.11 MIL/uL   Hemoglobin 12.5  12.0 - 15.0 g/dL   HCT 04.536.8  40.936.0 - 81.146.0 %   MCV 85.8  78.0 - 100.0 fL   MCH 29.1  26.0 - 34.0 pg   MCHC 34.0  30.0 - 36.0 g/dL   RDW 91.413.6  78.211.5 - 95.615.5 %   Platelets 337  150 - 400 K/uL    IMAGING [place] Ob Comp Less 14 Wks  06/11/2014   CLINICAL DATA:  Vaginal bleeding.  EXAM: OBSTETRIC <14 WK US AND TRANSVAGINAL OB US  TECHNIQUE: Both transabdominal and transvaginal ultrasound examinations were performed for complete evaluation of the gestation as well as the maternal uterus, adnexal regions, and pelvic cul-de-sac. Transvaginal technique was performed to assess early pregnancy.   COMPARISON:  None.  FINDINGS: Intrauterine gestational sac:  None seen.  Yolk sac:  N/A  Embryo:  N/A  Maternal uterus/adnexae: Complex fluid and clot are seen within the endometrial canal, compatible with the patient's active vaginal bleeding. No abnormal focal blood flow seen on limited Doppler evaluation to suggest retained products of conception.  The ovaries are unremarkable in appearance. The right ovary measures 2.0 x 1.6 x 1.0 cm, while the left ovary measures 3.3 x 1.6 x 1.9 cm. No suspicious adnexal masses are seen; there is no evidence for ovarian torsion.  No free fluid is seen within the pelvic cul-de-sac.  IMPRESSION: 1. No intrauterine gestational sac seen. Findings compatible with spontaneous abortion. 2. Complex fluid and clot noted filling the endometrial canal, compatible with the patient's active vaginal bleeding. No evidence for retained products of conception.   Electronically Signed   By: Roanna RaiderJeffery  Chang M.D.   On: 06/11/2014 02:58   [place] Ob Transvaginal  06/11/2014   CLINICAL DATA:  Vaginal bleeding.  EXAM: OBSTETRIC <14 WK US AND TRANSVAGINAL OB US  TECHNIQUE: Both transabdominal and transvaginal ultrasound examinations were performed for complete evaluation of the gestation as well as the maternal uterus, adnexal regions, and pelvic cul-de-sac. Transvaginal technique was performed to assess early pregnancy.  COMPARISON:  None.  FINDINGS: Intrauterine gestational sac:  None seen.  Yolk sac:  N/A  Embryo:  N/A  Maternal uterus/adnexae: Complex fluid and clot are seen within the endometrial canal, compatible with the patient's active vaginal bleeding. No abnormal focal blood flow seen on limited Doppler evaluation to suggest retained products of conception.  The ovaries are unremarkable in appearance. The right ovary measures 2.0 x 1.6 x 1.0 cm, while the left ovary measures 3.3 x 1.6 x 1.9 cm. No suspicious adnexal masses are seen; there is no evidence for ovarian torsion.  No free fluid is  seen within the pelvic cul-de-sac.  IMPRESSION: 1. No intrauterine gestational sac seen. Findings compatible with spontaneous abortion. 2. Complex fluid and clot noted filling the endometrial canal, compatible with the patient's active vaginal bleeding. No evidence for retained products of conception.   Electronically Signed   By: Roanna RaiderJeffery  Chang M.D.   On: 06/11/2014 02:58   MAU COURSE  ASSESSMENT 1. Incomplete miscarriage    PLAN Discharge home in stable condition. Bleeding precautions.  Support given. Declines [place].     Follow-up Information   Follow up with [facility] In 1 week. (follow-up miscarriage)    Specialty:  Obstetrics and Gynecology   Contact information:   [address] [place] [address] [phone]      Follow up with [facility]  MATERNITY ADMISSIONS. (As needed in emergencies)    Contact information:   [address] [id number] [place] [address] [phone]       Medication List         ibuprofen 600 MG tablet  Commonly known as:  ADVIL,MOTRIN  Take 1 tablet (600 mg total) by mouth every 6 (six) hours as needed for cramping.       [place], CNM 06/11/2014  3:29 AM

## 2014-06-11 NOTE — MAU Note (Signed)
PT  SAYS SHE SAW BLEEDING ON SAT-  WHEN WIPED -  PINK-   STOPPED.   THEN LAST  NIGHT - WHEN WIPED - WAS DARK RED.      THIS AM HEAVY RED  BLEEDING.  THEN STARTED PASSING CLOTS-  GOLF BALL SIZE-     AT 11AM-  AND HAS ALL DAY.        [facility]  [facility]-  THEY DEL BABY- 2013-,   THEY TOOK OUT INPLANON IN 04-2013.     LAST SEX-    SAT NIGHT.   IN TRIAGE - PAD - SMALL  AMT RED.

## 2014-06-13 NOTE — MAU Provider Note (Signed)

## 2014-06-25 ENCOUNTER — Ambulatory Visit: Payer: 59 | Admitting: Obstetrics & Gynecology

## 2014-06-25 ENCOUNTER — Encounter: Payer: 59 | Admitting: Obstetrics & Gynecology

## 2014-06-25 ENCOUNTER — Telehealth: Payer: Self-pay | Admitting: *Deleted

## 2014-06-25 NOTE — Telephone Encounter (Signed)
Jodi Mcdonald missed an appointment for MAU follow up for incomplete miscarriage. Called Jodi Mcdonald and she states she called yesterday- had court today- does want to reschedule. Informed her front office will call her with appointment

## 2014-07-02 ENCOUNTER — Ambulatory Visit (INDEPENDENT_AMBULATORY_CARE_PROVIDER_SITE_OTHER): Payer: 59 | Admitting: Obstetrics and Gynecology

## 2014-07-02 ENCOUNTER — Encounter: Payer: Self-pay | Admitting: Obstetrics and Gynecology

## 2014-07-02 VITALS — BP 109/77 | HR 76 | Temp 98.1°F | Ht 64.0 in | Wt 249.3 lb

## 2014-07-02 DIAGNOSIS — O039 Complete or unspecified spontaneous abortion without complication: Secondary | ICD-10-CM

## 2014-07-02 MED ORDER — NORGESTIMATE-ETH ESTRADIOL 0.25-35 MG-MCG PO TABS: 1.0000 | ORAL_TABLET | Freq: Every day | ORAL | Status: DC

## 2014-07-02 NOTE — Progress Notes (Signed)
   Subjective:    Patient ID: Jodi Mcdonald, female    DOB: 1990/12/29, 23 y.o.   MRN: [id number]  HPI  6021 G2P1001 diagnosed with SAB on 06/11/2014 presenting today as an MAU follow up. She reports that the vaginal bleeding stopped the week of 6/29. Patient is without complaints. This was not a planned pregnancy and the patient is interested in contraception.  Past Medical History  Diagnosis Date  . H/O cystitis   . H/O chlamydia infection 2009  . H/O gonorrhea 2012   . Trichomonas 2009  . History of being obese   . Infection     UTI   Past Surgical History  Procedure Laterality Date  . Wisdom tooth extraction    . No past surgeries     Family History  Problem Relation Age of Onset  . Anesthesia problems Neg Hx   . Depression Mother   . Hypertension Mother   . Hypertension Father   . Diabetes Maternal Aunt   . Cancer Paternal Aunt   . Diabetes Maternal Grandmother    History  Substance Use Topics  . Smoking status: Current Every Day Smoker -- 0.25 packs/day    Types: Cigarettes  . Smokeless tobacco: Never Used     Comment: quit recently  . Alcohol Use: Yes     Comment: ocassionally     Review of Systems     Objective:   Physical Exam  GENERAL: Well-developed, well-nourished female in no acute distress. Obese ABDOMEN: Soft, nontender, nondistended. No organomegaly. PELVIC: Normal external female genitalia. Vagina is pink and rugated.  Normal discharge. Normal appearing cervix. Uterus is normal in size. No adnexal mass or tenderness. EXTREMITIES: No cyanosis, clubbing, or edema, 2+ distal pulses.       Assessment & Plan:  23 yo with spontaneous abortion here for follow up - Quant HCG today - Different birth control options discussed- Patient opted for OCP Rx Sprintec provided - RTC in 3 months for BP follow up and annual exam

## 2014-07-03 ENCOUNTER — Telehealth: Payer: Self-pay | Admitting: *Deleted

## 2014-07-03 LAB — HCG, QUANTITATIVE, PREGNANCY

## 2014-07-03 NOTE — Telephone Encounter (Signed)
Message copied by Dorothyann PengHAIZLIP, Arland Usery E on Thu Jul 03, 2014  2:53 PM ------      Message from: CONSTANT, PEGGY      Created: Thu Jul 03, 2014  2:43 PM       Please inform patient of complete resolution of pregnancy. Patient is cleared to start OCP this Sunday ------

## 2014-07-03 NOTE — Telephone Encounter (Signed)
Contacted patient, gave results and recommendation from Dr. Jolayne Pantheronstant.  Pt verbalizes understanding.

## 2014-08-24 ENCOUNTER — Encounter (HOSPITAL_COMMUNITY): Payer: Self-pay | Admitting: *Deleted

## 2014-08-24 ENCOUNTER — Inpatient Hospital Stay (HOSPITAL_COMMUNITY)
Admission: AD | Admit: 2014-08-24 | Discharge: 2014-08-24 | Disposition: A | Discharge status: Home / Self Care | Payer: 59 | Source: Ambulatory Visit | Attending: Obstetrics & Gynecology | Admitting: Obstetrics & Gynecology

## 2014-08-24 DIAGNOSIS — N898 Other specified noninflammatory disorders of vagina: Secondary | ICD-10-CM | POA: Diagnosis not present

## 2014-08-24 NOTE — MAU Note (Signed)
Pt not in lobby.  

## 2014-08-24 NOTE — MAU Note (Signed)
Miscarriage a month ago, stopped bleeding and started having a thick white discharge.  Pt states it itches sometimes.  Denies any cramping.

## 2014-10-27 ENCOUNTER — Encounter (HOSPITAL_COMMUNITY): Payer: Self-pay | Admitting: *Deleted

## 2015-05-13 ENCOUNTER — Emergency Department (HOSPITAL_COMMUNITY)
Admission: EM | Admit: 2015-05-13 | Discharge: 2015-05-14 | Disposition: A | Discharge status: Home / Self Care | Payer: 59 | Attending: Emergency Medicine | Admitting: Emergency Medicine

## 2015-05-13 ENCOUNTER — Encounter (HOSPITAL_COMMUNITY): Payer: Self-pay | Admitting: Emergency Medicine

## 2015-05-13 DIAGNOSIS — Z8619 Personal history of other infectious and parasitic diseases: Secondary | ICD-10-CM | POA: Diagnosis not present

## 2015-05-13 DIAGNOSIS — Z793 Long term (current) use of hormonal contraceptives: Secondary | ICD-10-CM | POA: Diagnosis not present

## 2015-05-13 DIAGNOSIS — Z720 Tobacco use: Secondary | ICD-10-CM | POA: Insufficient documentation

## 2015-05-13 DIAGNOSIS — E669 Obesity, unspecified: Secondary | ICD-10-CM | POA: Insufficient documentation

## 2015-05-13 DIAGNOSIS — Z87440 Personal history of urinary (tract) infections: Secondary | ICD-10-CM | POA: Insufficient documentation

## 2015-05-13 DIAGNOSIS — N898 Other specified noninflammatory disorders of vagina: Secondary | ICD-10-CM | POA: Diagnosis not present

## 2015-05-13 DIAGNOSIS — Z87448 Personal history of other diseases of urinary system: Secondary | ICD-10-CM | POA: Diagnosis not present

## 2015-05-13 DIAGNOSIS — Z3202 Encounter for pregnancy test, result negative: Secondary | ICD-10-CM | POA: Diagnosis not present

## 2015-05-13 DIAGNOSIS — Z7251 High risk heterosexual behavior: Secondary | ICD-10-CM

## 2015-05-13 LAB — CBC WITH DIFFERENTIAL/PLATELET
Basophils Absolute: 0 10*3/uL (ref 0.0–0.1)
Basophils Relative: 0 % (ref 0–1)
Eosinophils Absolute: 0.1 10*3/uL (ref 0.0–0.7)
Eosinophils Relative: 1 % (ref 0–5)
HCT: 38.8 % (ref 36.0–46.0)
Hemoglobin: 12.4 g/dL (ref 12.0–15.0)
Lymphocytes Relative: 26 % (ref 12–46)
Lymphs Abs: 3.1 10*3/uL (ref 0.7–4.0)
MCH: 27 pg (ref 26.0–34.0)
MCHC: 32 g/dL (ref 30.0–36.0)
MCV: 84.3 fL (ref 78.0–100.0)
Monocytes Absolute: 0.7 10*3/uL (ref 0.1–1.0)
Monocytes Relative: 6 % (ref 3–12)
Neutro Abs: 8 10*3/uL — ABNORMAL HIGH (ref 1.7–7.7)
Neutrophils Relative %: 67 % (ref 43–77)
Platelets: 357 10*3/uL (ref 150–400)
RBC: 4.6 MIL/uL (ref 3.87–5.11)
RDW: 14.3 % (ref 11.5–15.5)
WBC: 11.9 10*3/uL — ABNORMAL HIGH (ref 4.0–10.5)

## 2015-05-13 LAB — COMPREHENSIVE METABOLIC PANEL
ALBUMIN: 3.8 g/dL (ref 3.5–5.0)
ALT: 20 U/L (ref 14–54)
AST: 21 U/L (ref 15–41)
Alkaline Phosphatase: 41 U/L (ref 38–126)
Anion gap: 6 (ref 5–15)
BUN: 17 mg/dL (ref 6–20)
CALCIUM: 9.3 mg/dL (ref 8.9–10.3)
CO2: 26 mmol/L (ref 22–32)
CREATININE: 0.91 mg/dL (ref 0.44–1.00)
Chloride: 107 mmol/L (ref 101–111)
GFR calc Af Amer: >60 mL/min (ref >60)
Glucose, Bld: 107 mg/dL — ABNORMAL HIGH (ref 65–99)
Potassium: 3.7 mmol/L (ref 3.5–5.1)
Sodium: 139 mmol/L (ref 135–145)
TOTAL PROTEIN: 7.6 g/dL (ref 6.5–8.1)
Total Bilirubin: 0.6 mg/dL (ref 0.3–1.2)

## 2015-05-13 LAB — LIPASE, BLOOD: Lipase: 18 U/L — ABNORMAL LOW (ref 22–51)

## 2015-05-13 NOTE — ED Provider Notes (Signed)
CSN: [id number]     Arrival date & time 05/13/15  2227 History   First MD Initiated Contact with Patient 05/13/15 2245     Chief Complaint  Patient presents with  . Abdominal Pain  . Vaginal Discharge     (Consider location/radiation/quality/duration/timing/severity/associated sxs/prior Treatment) The history is provided by the patient. No language interpreter was used.  Jodi Mcdonald is a 24 y/o F with PMHx of gonorrhea, chlamydia, trichomonas, cystitis presenting to the ED with vaginal discharge and abdominal pain that started 2 days ago. Patient reported that the discomfort is localized to the suprapubic region of her abdomen described as a dull aching pain that comes and goes without radiation. Stated that the vaginal discharge is a thick white consistency without an odor. Patient reported that her LMP was 05/01/2015. Patient reported that she is currently not on any form of birth control. Reported that she is sexually active and sometimes uses protection. Denied nausea, vomiting, diarrhea, melena, hematochezia, back pain, neck pain, neck stiffness, fever, chills, chest pain, shortness of breath, difficulty breathing, abnormal vaginal bleeding, vaginal itching, vaginal irritation, sores or lesions, sore throat, difficulty swallowing, dysuria, hematuria. PCP none OB/GYN none  Past Medical History  Diagnosis Date  . H/O cystitis   . H/O chlamydia infection 2009  . H/O gonorrhea 2012   . Trichomonas 2009  . History of being obese   . Infection     UTI   Past Surgical History  Procedure Laterality Date  . Wisdom tooth extraction    . No past surgeries     Family History  Problem Relation Age of Onset  . Anesthesia problems Neg Hx   . Depression Mother   . Hypertension Mother   . Hypertension Father   . Diabetes Maternal Aunt   . Cancer Paternal Aunt   . Diabetes Maternal Grandmother    History  Substance Use Topics  . Smoking status: Current Every Day Smoker -- 0.25  packs/day    Types: Cigarettes  . Smokeless tobacco: Never Used     Comment: quit recently  . Alcohol Use: Yes     Comment: ocassionally   OB History    Gravida Para Term Preterm AB TAB SAB Ectopic Multiple Living   0 0 0 0 0 0 1     Review of Systems  Constitutional: Negative for fever and chills.  Respiratory: Negative for shortness of breath.   Cardiovascular: Negative for chest pain.  Gastrointestinal: Positive for abdominal pain. Negative for nausea, vomiting, diarrhea, constipation, blood in stool and anal bleeding.  Genitourinary: Positive for vaginal discharge. Negative for dysuria, flank pain, vaginal bleeding, vaginal pain and pelvic pain.  Musculoskeletal: Negative for back pain, neck pain and neck stiffness.      Allergies  Review of patient's allergies indicates no known allergies.  Home Medications   Prior to Admission medications   Medication Sig Start Date End Date Taking? Authorizing Provider  ibuprofen (ADVIL,MOTRIN) 600 MG tablet Take 1 tablet (600 mg total) by mouth every 6 (six) hours as needed for cramping. Patient not taking: Reported on 05/13/2015 06/11/14   Dorathy Kinsman, CNM  norgestimate-ethinyl estradiol (ORTHO-CYCLEN,SPRINTEC,PREVIFEM) 0.25-35 MG-MCG tablet Take 1 tablet by mouth daily. Patient not taking: Reported on 05/13/2015 07/02/14   Peggy Constant, MD   BP 110/70 mmHg  Pulse 81  Temp(Src) 98.3 F (36.8 C) (Oral)  Resp 16  SpO2 96%  LMP 04/30/2015 Physical Exam  Constitutional: She is oriented to person, place, and time.  She appears well-developed and well-nourished. No distress.  HENT:  Head: Normocephalic and atraumatic.  Eyes: Conjunctivae and EOM are normal. Right eye exhibits no discharge. Left eye exhibits no discharge.  Neck: Normal range of motion. Neck supple.  Cardiovascular: Normal rate, regular rhythm and normal heart sounds.   Pulses:      Radial pulses are 2+ on the right side, and 2+ on the left side.   Pulmonary/Chest: Effort normal and breath sounds normal. No respiratory distress. She has no wheezes. She has no rales.  Abdominal: Soft. Bowel sounds are normal. She exhibits no distension. There is no tenderness. There is no rebound, no guarding and no CVA tenderness.  Genitourinary:  Pelvic exam: Negative swelling, erythema, inflammation, lesions, sores, deformities, ulcerations, induration or fluctuance noted to the external genitalia. Negative bright red blood in the vaginal vault. Thin appearing white discharge noted with negative odor. Cervix identified with negative friability. Negative CMT or adnexal tenderness noted.  Exam chaperoned with tech.   Musculoskeletal: Normal range of motion.  Neurological: She is alert and oriented to person, place, and time. No cranial nerve deficit. She exhibits normal muscle tone. Coordination normal.  Skin: Skin is warm and dry. No rash noted. She is not diaphoretic. No erythema.  Psychiatric: She has a normal mood and affect. Her behavior is normal. Thought content normal.  Nursing note and vitals reviewed.   ED Course  Procedures (including critical care time)  Results for orders placed or performed during the hospital encounter of 05/13/15  Wet prep, genital  Result Value Ref Range   Yeast Wet Prep HPF POC NONE SEEN NONE SEEN   Trich, Wet Prep NONE SEEN NONE SEEN   Clue Cells Wet Prep HPF POC FEW (A) NONE SEEN   WBC, Wet Prep HPF POC MANY (A) NONE SEEN  Pregnancy, urine  Result Value Ref Range   Preg Test, Ur NEGATIVE NEGATIVE  CBC with Differential/Platelet  Result Value Ref Range   WBC 11.9 (H) 4.0 - 10.5 K/uL   RBC 4.60 3.87 - 5.11 MIL/uL   Hemoglobin 12.4 12.0 - 15.0 g/dL   HCT 16.1 09.6 - 04.5 %   MCV 84.3 78.0 - 100.0 fL   MCH 27.0 26.0 - 34.0 pg   MCHC 32.0 30.0 - 36.0 g/dL   RDW 40.9 81.1 - 91.4 %   Platelets 357 150 - 400 K/uL   Neutrophils Relative % 67 43 - 77 %   Neutro Abs 8.0 (H) 1.7 - 7.7 K/uL   Lymphocytes Relative  26 12 - 46 %   Lymphs Abs 3.1 0.7 - 4.0 K/uL   Monocytes Relative 6 3 - 12 %   Monocytes Absolute 0.7 0.1 - 1.0 K/uL   Eosinophils Relative 1 0 - 5 %   Eosinophils Absolute 0.1 0.0 - 0.7 K/uL   Basophils Relative 0 0 - 1 %   Basophils Absolute 0.0 0.0 - 0.1 K/uL  Comprehensive metabolic panel  Result Value Ref Range   Sodium 139 135 - 145 mmol/L   Potassium 3.7 3.5 - 5.1 mmol/L   Chloride 107 101 - 111 mmol/L   CO2 26 22 - 32 mmol/L   Glucose, Bld 107 (H) 65 - 99 mg/dL   BUN 17 6 - 20 mg/dL   Creatinine, Ser 7.82 0.44 - 1.00 mg/dL   Calcium 9.3 8.9 - 95.6 mg/dL   Total Protein 7.6 6.5 - 8.1 g/dL   Albumin 3.8 3.5 - 5.0 g/dL   AST 21 15 -  41 U/L   ALT 20 14 - 54 U/L   Alkaline Phosphatase 41 38 - 126 U/L   Total Bilirubin 0.6 0.3 - 1.2 mg/dL   GFR calc non Af Amer >60 >60 mL/min   GFR calc Af Amer >60 >60 mL/min   Anion gap 6 5 - 15  Lipase, blood  Result Value Ref Range   Lipase 18 (L) 22 - 51 U/L  Urinalysis, Routine w reflex microscopic  Result Value Ref Range   Color, Urine YELLOW YELLOW   APPearance CLEAR CLEAR   Specific Gravity, Urine 1.029 1.005 - 1.030   pH 6.5 5.0 - 8.0   Glucose, UA NEGATIVE NEGATIVE mg/dL   Hgb urine dipstick NEGATIVE NEGATIVE   Bilirubin Urine NEGATIVE NEGATIVE   Ketones, ur NEGATIVE NEGATIVE mg/dL   Protein, ur NEGATIVE NEGATIVE mg/dL   Urobilinogen, UA 0.2 0.0 - 1.0 mg/dL   Nitrite NEGATIVE NEGATIVE   Leukocytes, UA NEGATIVE NEGATIVE    Labs Review Labs Reviewed  WET PREP, GENITAL - Abnormal; Notable for the following:    Clue Cells Wet Prep HPF POC FEW (*)    WBC, Wet Prep HPF POC MANY (*)    All other components within normal limits  CBC WITH DIFFERENTIAL/PLATELET - Abnormal; Notable for the following:    WBC 11.9 (*)    Neutro Abs 8.0 (*)    All other components within normal limits  COMPREHENSIVE METABOLIC PANEL - Abnormal; Notable for the following:    Glucose, Bld 107 (*)    All other components within normal limits   LIPASE, BLOOD - Abnormal; Notable for the following:    Lipase 18 (*)    All other components within normal limits  PREGNANCY, URINE  URINALYSIS, ROUTINE W REFLEX MICROSCOPIC  GC/CHLAMYDIA PROBE AMP ([place])    Imaging Review No results found.   EKG Interpretation None      MDM   Final diagnoses:  Vaginal discharge  History of unprotected sex    Medications  cefTRIAXone (ROCEPHIN) injection 250 mg (250 mg Intramuscular Given 05/14/15 0136)  azithromycin (ZITHROMAX) tablet 1,000 mg (1,000 mg Oral Given 05/14/15 0133)  lidocaine (XYLOCAINE) 1 % (with pres) injection (1 mL  Given 05/14/15 0137)    Filed Vitals:   05/13/15 2236 05/14/15 0053 05/14/15 0157  BP: 127/74 120/73 110/70  Pulse: 80 83 81  Temp: 98.3 F (36.8 C)    TempSrc: Oral    Resp: 20 16 16   SpO2: 95% 99% 96%   CBC noted mildly elevated white blood cell count 11.9. Hemoglobin 12.4, hematocrit 30.8. CMP unremarkable. Lipase negative elevation. Urine pregnancy negative. Urinalysis negative for hemoglobin, nitrites, leukocytes. Wet prep negative yeast, negative trichomoniasis, few clue cells with many white blood cells noted. GC/Chlamydia probe pending.  Negative findings of UTI or pyelonephritis. Doubt ectopic pregnancy. Abdominal exam benign - negative pain upon palpation, abdomen soft - negative findings to suggest acute abdomen. Negative pain during pelvic exam. Doubt PID. Patient treated prophylactically secondary to unprotected sexual events and history of STD. Patient stable, afebrile. Patient not septic appearing. Negative signs of sepsis. Negative signs of respiratory distress. Discharged patient. Discussed with patient to rest and stay hydrated. Discussed with patient to avoid any sexual activity until labs results come back. Educated patient on STDs and recommended partner(s) to be tested as well. [facility] Discussed with patient to closely monitor  symptoms and if symptoms are to worsen or change to report back  to the ED - strict return instructions given.  Patient agreed to plan of care, understood, all questions answered.   Raymon MuttonMarissa Sindy Mccune, PA-C 05/14/15 0225  Raymon MuttonMarissa Zohair Epp, PA-C 05/14/15 [phone]  Loren Raceravid Yelverton, MD 05/15/15 [phone]

## 2015-05-13 NOTE — ED Notes (Signed)
Pt states that she has had abdominal pain and white vaginal discharge x 2 days. Alert and oriented.

## 2015-05-14 LAB — WET PREP, GENITAL
TRICH WET PREP: NONE SEEN
YEAST WET PREP: NONE SEEN

## 2015-05-14 LAB — URINALYSIS, ROUTINE W REFLEX MICROSCOPIC
BILIRUBIN URINE: NEGATIVE
Glucose, UA: NEGATIVE mg/dL
Hgb urine dipstick: NEGATIVE
Ketones, ur: NEGATIVE mg/dL
LEUKOCYTES UA: NEGATIVE
Nitrite: NEGATIVE
Protein, ur: NEGATIVE mg/dL
Specific Gravity, Urine: 1.029 (ref 1.005–1.030)
Urobilinogen, UA: 0.2 mg/dL (ref 0.0–1.0)
pH: 6.5 (ref 5.0–8.0)

## 2015-05-14 LAB — GC/CHLAMYDIA PROBE AMP (~~LOC~~) NOT AT ARMC
CHLAMYDIA, DNA PROBE: NEGATIVE
NEISSERIA GONORRHEA: NEGATIVE

## 2015-05-14 LAB — PREGNANCY, URINE: Preg Test, Ur: NEGATIVE

## 2015-05-14 MED ORDER — LIDOCAINE HCL 1 % IJ SOLN
INTRAMUSCULAR | Status: AC
  Administered 2015-05-14: 1 mL
  Filled 2015-05-14: qty 20

## 2015-05-14 MED ORDER — CEFTRIAXONE SODIUM 250 MG IJ SOLR
250.0000 mg | Freq: Once | INTRAMUSCULAR | Status: AC
  Administered 2015-05-14: 250 mg via INTRAMUSCULAR
  Filled 2015-05-14: qty 250

## 2015-05-14 MED ORDER — AZITHROMYCIN 250 MG PO TABS
1000.0000 mg | ORAL_TABLET | Freq: Once | ORAL | Status: AC
  Administered 2015-05-14: 1000 mg via ORAL
  Filled 2015-05-14: qty 4

## 2015-05-14 NOTE — Discharge Instructions (Signed)
Please call your doctor for a followup appointment within 24-48 hours. When you talk to your doctor please let them know that you were seen in the emergency department and have them acquire all of your records so that they can discuss the findings with you and formulate a treatment plan to fully care for your new and ongoing problems. Please follow-up with health and wellness Center Please follow-up with OB/GYN Please avoid any sexual activity until results have returned Please have partner(s) within the past 3-6 months tested for any sexually transmitted disease Please always use protection, protection will prevent transmission of STD and HIV Please continue to monitor symptoms closely and if symptoms are to worsen or change (fever greater than 101, chills, sweating, nausea, vomiting, chest pain, shortness of breathe, difficulty breathing, weakness, numbness, tingling, worsening or changes to pain pattern, stomach pain, inability keep food or fluids down, blood in the stools, black tarry stools, change in worsening to vaginal discharge, vaginal pain, burning with urination, back pain, neck pain, neck stiffness) please report back to the Emergency Department immediately.   Sexually Transmitted Disease A sexually transmitted disease (STD) is a disease or infection that may be passed (transmitted) from person to person, usually during sexual activity. This may happen by way of saliva, semen, blood, vaginal mucus, or urine. Common STDs include:   Gonorrhea.   Chlamydia.   Syphilis.   HIV and AIDS.   Genital herpes.   Hepatitis B and C.   Trichomonas.   Human papillomavirus (HPV).   Pubic lice.   Scabies.  Mites.  Bacterial vaginosis. WHAT ARE CAUSES OF STDs? An STD may be caused by bacteria, a virus, or parasites. STDs are often transmitted during sexual activity if one person is infected. However, they may also be transmitted through nonsexual means. STDs may be transmitted  after:   Sexual intercourse with an infected person.   Sharing sex toys with an infected person.   Sharing needles with an infected person or using unclean piercing or tattoo needles.  Having intimate contact with the genitals, mouth, or rectal areas of an infected person.   Exposure to infected fluids during birth. WHAT ARE THE SIGNS AND SYMPTOMS OF STDs? Different STDs have different symptoms. Some people may not have any symptoms. If symptoms are present, they may include:   Painful or bloody urination.   Pain in the pelvis, abdomen, vagina, anus, throat, or eyes.   A skin rash, itching, or irritation.  Growths, ulcerations, blisters, or sores in the genital and anal areas.  Abnormal vaginal discharge with or without bad odor.   Penile discharge in men.   Fever.   Pain or bleeding during sexual intercourse.   Swollen glands in the groin area.   Yellow skin and eyes (jaundice). This is seen with hepatitis.   Swollen testicles.  Infertility.  Sores and blisters in the mouth. HOW ARE STDs DIAGNOSED? To make a diagnosis, your health care provider may:   Take a medical history.   Perform a physical exam.   Take a sample of any discharge to examine.  Swab the throat, cervix, opening to the penis, rectum, or vagina for testing.  Test a sample of your first morning urine.   Perform blood tests.   Perform a Pap test, if this applies.   Perform a colposcopy.   Perform a laparoscopy.  HOW ARE STDs TREATED? Treatment depends on the STD. Some STDs may be treated but not cured.   Chlamydia, gonorrhea, trichomonas, and  syphilis can be cured with antibiotic medicine.   Genital herpes, hepatitis, and HIV can be treated, but not cured, with prescribed medicines. The medicines lessen symptoms.   Genital warts from HPV can be treated with medicine or by freezing, burning (electrocautery), or surgery. Warts may come back.   HPV cannot be cured  with medicine or surgery. However, abnormal areas may be removed from the cervix, vagina, or vulva.   If your diagnosis is confirmed, your recent sexual partners need treatment. This is true even if they are symptom-free or have a negative culture or evaluation. They should not have sex until their health care providers say it is okay. HOW CAN I REDUCE MY RISK OF GETTING AN STD? Take these steps to reduce your risk of getting an STD:  Use latex condoms, dental dams, and water-soluble lubricants during sexual activity. Do not use petroleum jelly or oils.  Avoid having multiple sex partners.  Do not have sex with someone who has other sex partners.  Do not have sex with anyone you do not know or who is at high risk for an STD.  Avoid risky sex practices that can break your skin.  Do not have sex if you have open sores on your mouth or skin.  Avoid drinking too much alcohol or taking illegal drugs. Alcohol and drugs can affect your judgment and put you in a vulnerable position.  Avoid engaging in oral and anal sex acts.  Get vaccinated for HPV and hepatitis. If you have not received these vaccines in the past, talk to your health care provider about whether one or both might be right for you.   If you are at risk of being infected with HIV, it is recommended that you take a prescription medicine daily to prevent HIV infection. This is called pre-exposure prophylaxis (PrEP). You are considered at risk if:  You are a man who has sex with other men (MSM).  You are a heterosexual man or woman and are sexually active with more than one partner.  You take drugs by injection.  You are sexually active with a partner who has HIV.  Talk with your health care provider about whether you are at high risk of being infected with HIV. If you choose to begin PrEP, you should first be tested for HIV. You should then be tested every 3 months for as long as you are taking PrEP.  WHAT SHOULD I DO IF I  THINK I HAVE AN STD?  See your health care provider.   Tell your sexual partner(s). They should be tested and treated for any STDs.  Do not have sex until your health care provider says it is okay. WHEN SHOULD I GET IMMEDIATE MEDICAL CARE? Contact your health care provider right away if:   You have severe abdominal pain.  You are a man and notice swelling or pain in your testicles.  You are a woman and notice swelling or pain in your vagina. Document Released: 03/04/2003 Document Revised: 12/17/2013 Document Reviewed: 07/02/2013 [facility] Patient Information 2015 [place], [place]. This information is not intended to replace advice given to you by your health care provider. Make sure you discuss any questions you have with your health care provider.   Emergency Department Resource Guide 1) Find a Doctor and Pay Out of Pocket Although you won't have to find out who is covered by your insurance plan, it is a good idea to ask around and get recommendations. You will then need to  call the office and see if the doctor you have chosen will accept you as a new patient and what types of options they offer for patients who are self-pay. Some doctors offer discounts or will set up payment plans for their patients who do not have insurance, but you will need to ask so you aren't surprised when you get to your appointment.  2) Contact Your Local Health Department Not all health departments have doctors that can see patients for sick visits, but many do, so it is worth a call to see if yours does. If you don't know where your local health department is, you can check in your phone book. The CDC also has a tool to help you locate your state's health department, and many state websites also have listings of all of their local health departments.  3) Find a Walk-in Clinic If your illness is not likely to be very severe or complicated, you may want to try a walk in clinic. These are popping up all over the  country in pharmacies, drugstores, and shopping centers. They're usually staffed by nurse practitioners or physician assistants that have been trained to treat common illnesses and complaints. They're usually fairly quick and inexpensive. However, if you have serious medical issues or chronic medical problems, these are probably not your best option.  No Primary Care Doctor: - Call Health Connect at  [phone] - they can help you locate a primary care doctor that  accepts your insurance, provides certain services, etc. - Physician Referral Service- [phone]  Chronic Pain Problems: Organization         Address  Phone   Notes  [facility]  [phone] Patients need to be referred by their primary care doctor.   Medication Assistance: Organization         Address  Phone   Notes  [facility] [address] [place], [place] [address] [phone]) [phone] --Must be a resident of [facility] -- Must have NO insurance coverage whatsoever (no Medicaid/ Medicare, etc.) -- The pt. MUST have a primary care doctor that directs their care regularly and follows them in the community   MedAssist  [phone]) [phone]   [employer]  [phone]    Agencies that provide inexpensive medical care: Organization         Address  Phone   Notes  [facility]  [phone]   [facility]    [phone]) [phone]   [facility] [address] [place], [place] [address] [phone]) [phone]   [facility] [address], [place] [phone]) [phone]   Planned Parenthood    [phone]) [phone]   [facility]    [phone](336) [phone]   [facility]  [address], [place] Phone:  [phone], Fax:  [phone]) [phone] Hours of Operation:  9 am - 6 pm, M-F.  Also accepts Medicaid/Medicare and self-pay.  [facility]  [address], [place] Phone: [phone]) [phone], Fax: [phone]. Hours of Operation:  8:30 am - 5:30 pm, M-F.  Also accepts Medicaid and self-pay.  [facility] [place] [address], [place] Phone: [phone]) [phone]   [facility] [address], [place], [place] [phone], Ext. [phone] Mondays & Thursdays: 7-9 AM.  First 15 patients are seen on a first come, first serve basis.  Medicaid-accepting [facility] Providers:  Organization         Address  Phone   Notes  [facility] [address], [place] [phone] Also accepts self-pay patients.  [facility] [address], [place]  [phone]   [facility] [address], [place] [phone]) [phone]   [facility] [address] [place] [address], [place] [phone]   Renaye RakersVeita Bland [address], [place]   [phone] Only accepts [place] Access [place] patients after they have their name applied to their card.   Self-Pay (no insurance) in [facility]:  Organization         Address  Phone   Notes  Sickle Cell Patients, [facility] [address], [place] [phone]   [facility] [address], [place] [phone]   [facility] [place]  [address], [place] [phone]   [facility]/Dr. Osei-Bonsu  [address] [place] [address], [place] or [phone], [place] [phone] Phone number for both [place] and [place] locations is the same.  [facility] [address], [place] [phone]   [facility] [place] [address] [place] [address], [place] or [address] [phone]) [phone]) [phone]   [facility] [address], [place] [phone], phone; [phone], fax Sees patients 1st and 3rd Saturday of every month.  Must  not qualify for public or private insurance (i.e. Medicaid, Medicare, [place] Health Choice, Veterans' Benefits)  Household income should be no more than 200% of the poverty level The clinic cannot treat you if you are pregnant or think you are pregnant  Sexually transmitted diseases are not treated at the clinic.    Dental Care: Organization         Address  Phone  Notes  [facility] [address], [place] [phone]) [phone] Accepts children up to age 321 who are enrolled in [place] or [place] Health Choice; pregnant women with a Medicaid card; and children who have applied for Medicaid or [place] Health Choice, but were declined, whose parents can pay a reduced fee at time of service.  [facility] [place]  [address], [place] [phone]) [phone] Accepts children up to age 22 who are enrolled in [place] or [place] Health Choice; pregnant women with a Medicaid card; and children who have applied for Medicaid or [place] Health Choice, but were declined, whose parents can pay a reduced fee at time of service.  [facility]  [address], [place] [phone]) [phone] Patients are seen by appointment only. Walk-ins are not accepted. [facility] will see patients 22 years of age and older. Monday - Tuesday (8am-5pm) Most Wednesdays (8:30-5pm) $30 per visit, cash only  [facility]  [address], [facility] [phone] Patients are seen by appointment only. Walk-ins are not accepted. [facility] will see patients 22 years of age and older. One Wednesday Evening (Monthly: Volunteer Based).  $30 per visit, cash only  [facility]  [phone] for adults; Children under age 584, call Graduate Pediatric Dentistry at [phone]) [phone]. Children aged 844-14, please call [phone] to request a pediatric  application.  Dental services are  provided in all areas of dental care including fillings, crowns and bridges, complete and partial dentures, implants, gum treatment, root canals, and extractions. Preventive care is also provided. Treatment is provided to both adults and children. Patients are selected via a lottery and there is often a waiting list.   [facility] [address] [place]  [phone] [email or web address]   [facility] [address] [place], [place] [phone], Ext. [phone] Second and Fourth Thursday of each month, opens at 6:30 AM; Clinic ends at 9 AM.  Patients are seen on a first-come first-served basis, and a limited number are seen during each clinic.   [facility]  [address] [place], [place] [phone]   Eligibility Requirements You must have lived in [place], [place], or [place] counties for at least the last three months.   You cannot be eligible for state or federal sponsored [place], including [employer], [place], or [employer].   You generally cannot be eligible for healthcare insurance through your employer.    How to apply: Eligibility screenings are held every Tuesday and Wednesday afternoon from 1:00 pm until 4:00 pm. You do not need an appointment for the interview!  [facility] [address] [place], [place] [phone]   [facility] Health Department  [phone]   [facility] Health Department  [phone]   [facility] Health Department  [phone]    Behavioral Health Resources in the Community: Intensive Outpatient Programs Organization         Address  Phone  Notes  [facility] [address], [place], [place] [phone]   [facility] [address] [place], [place] [phone]   [facility] [address], [place], [place]  [phone]   [facility] Health [address],  [place], [place]  [phone] or [phone]   Substance Abuse Resources Organization         Address  Phone  Notes  Alcohol and Drug Services  2814702123   [facility]  [phone]   [facility] [place]  [phone]   Floydene Flock  [phone]   Residential & Outpatient Substance Abuse Program  [phone]   Psychological Services Organization         Address  Phone  Notes  [facility]  [phone]   [facility]  [phone]   [facility] Mental Health [address] [place] [phone] or [phone]    Mobile Crisis Teams Organization         Address  Phone  Notes  [facility], Mobile Crisis Care Unit  [phone]   [facility]  [address] [place], [place] [phone]   Doristine Locks [address] [place] [phone]    Self-Help/Support Groups Organization         Address  Phone             Notes  [facility] [place] - variety of support groups  336- I7437963 Call for more information  Narcotics Anonymous (NA), Caring Services [address], [employer] [place]  2 meetings at this location   Statistician         Address  Phone  Notes  [facility] [address],    [place]  [phone]   [facility]  [address], [place] [address],  [place], [place] [phone]   [facility] [address] [place], [place] [phone] Admissions: 8am-3pm M-F  [facility] [address],    [place], [place] [phone]   [facility] [address] [place], [place], [place] [phone]   [facility] [address]  [place], [place] [phone]   [facility] - Intensive Outpatient [address], [place], [place] [phone]   [facility] ([facility] [address]  [place], [place] [phone] or  [phone]   [facility] (RT[facility]) [address], [place], [place] [phone] Accepts Medicaid  [facility] [place] [address]  [place] [phone] Substance Abuse/Addiction Treatment   [facility] Organization         Address  Phone  Notes  [facility]  [phone]   Angie Fava, PhD [address] [place], [place]   [phone] or [phone]   [facility] Behavioral   [address] [place], [place] [phone]   [facility] [address] [place], [place] [phone] Insurance/Medicaid/sponsorship through [facility] and Families [address]                                    [place], [place] [phone] Therapy/tele-psych/case  [facility] [address] [place], [place] [phone]    Dr. Lolly Mustache  [phone]   [facility]  [employer] [facility] Dept. 1) [address], [place] 2) [address] [place] 3)  [address], [place] [phone]  [phone]   [facility] Child Abuse Hotline 458-090-1472 or [phone] (After Hours)

## 2015-08-30 ENCOUNTER — Encounter (HOSPITAL_COMMUNITY): Payer: Self-pay | Admitting: *Deleted

## 2015-08-30 ENCOUNTER — Inpatient Hospital Stay (HOSPITAL_COMMUNITY)
Admission: AD | Admit: 2015-08-30 | Discharge: 2015-08-30 | Disposition: A | Discharge status: Home / Self Care | Payer: 59 | Source: Ambulatory Visit | Attending: Obstetrics and Gynecology | Admitting: Obstetrics and Gynecology

## 2015-08-30 DIAGNOSIS — R109 Unspecified abdominal pain: Secondary | ICD-10-CM | POA: Insufficient documentation

## 2015-08-30 DIAGNOSIS — Z3202 Encounter for pregnancy test, result negative: Secondary | ICD-10-CM | POA: Diagnosis not present

## 2015-08-30 DIAGNOSIS — A084 Viral intestinal infection, unspecified: Secondary | ICD-10-CM | POA: Insufficient documentation

## 2015-08-30 DIAGNOSIS — R112 Nausea with vomiting, unspecified: Secondary | ICD-10-CM | POA: Diagnosis present

## 2015-08-30 DIAGNOSIS — F17210 Nicotine dependence, cigarettes, uncomplicated: Secondary | ICD-10-CM | POA: Insufficient documentation

## 2015-08-30 LAB — URINALYSIS, ROUTINE W REFLEX MICROSCOPIC
BILIRUBIN URINE: NEGATIVE
GLUCOSE, UA: NEGATIVE mg/dL
HGB URINE DIPSTICK: NEGATIVE
KETONES UR: NEGATIVE mg/dL
Leukocytes, UA: NEGATIVE
Nitrite: NEGATIVE
PH: 6 (ref 5.0–8.0)
PROTEIN: NEGATIVE mg/dL
Specific Gravity, Urine: >1.03 — ABNORMAL HIGH (ref 1.005–1.030)
Urobilinogen, UA: 1 mg/dL (ref 0.0–1.0)

## 2015-08-30 LAB — CBC WITH DIFFERENTIAL/PLATELET
BASOS PCT: 0 % (ref 0–1)
Basophils Absolute: 0 10*3/uL (ref 0.0–0.1)
EOS ABS: 0.1 10*3/uL (ref 0.0–0.7)
Eosinophils Relative: 2 % (ref 0–5)
HCT: 37.5 % (ref 36.0–46.0)
HEMOGLOBIN: 12.6 g/dL (ref 12.0–15.0)
Lymphocytes Relative: 38 % (ref 12–46)
Lymphs Abs: 3.6 10*3/uL (ref 0.7–4.0)
MCH: 28.2 pg (ref 26.0–34.0)
MCHC: 33.6 g/dL (ref 30.0–36.0)
MCV: 83.9 fL (ref 78.0–100.0)
MONO ABS: 0.9 10*3/uL (ref 0.1–1.0)
MONOS PCT: 10 % (ref 3–12)
Neutro Abs: 4.7 10*3/uL (ref 1.7–7.7)
Neutrophils Relative %: 50 % (ref 43–77)
Platelets: 365 10*3/uL (ref 150–400)
RBC: 4.47 MIL/uL (ref 3.87–5.11)
RDW: 15.3 % (ref 11.5–15.5)
WBC: 9.4 10*3/uL (ref 4.0–10.5)

## 2015-08-30 LAB — HCG, QUANTITATIVE, PREGNANCY: hCG, Beta Chain, Quant, S: <1 m[IU]/mL (ref <5)

## 2015-08-30 MED ORDER — PROMETHAZINE HCL 25 MG PO TABS
12.5000 mg | ORAL_TABLET | Freq: Once | ORAL | Status: AC
  Administered 2015-08-30: 12.5 mg via ORAL
  Filled 2015-08-30: qty 1

## 2015-08-30 MED ORDER — PROMETHAZINE HCL 12.5 MG PO TABS: 12.5000 mg | ORAL_TABLET | Freq: Four times a day (QID) | ORAL | Status: DC | PRN

## 2015-08-30 NOTE — MAU Note (Signed)
Faint + HPT, unable to keep anything down for past five days.  Diarrhea just started today;  Had 3 episodes today, getting a headache.  Denies any vag discharge or bleeding.

## 2015-08-30 NOTE — MAU Provider Note (Signed)
History     CSN: [id number]  Arrival date and time: 08/30/15 1516   First Provider Initiated Contact with Patient 08/30/15 1553      Chief Complaint  Patient presents with  . Possible Pregnancy    HPI  [name] is a 24 y.o. G2P1001 who presents to MAU today with complaint of possible pregnancy and N/V. The patient states faint +HPT on Wednesday. She also states that N/V started that same day. She has been unable to keep anything down. She also endorses 3 episodes of loose stools today. She denies constipation, UTI symptoms, vaginal bleeding, discharge or sick contacts. She does also endorse intermittent mild lower abdominal cramping.   OB History    Gravida Para Term Preterm AB TAB SAB Ectopic Multiple Living   0 1 0 1 0 0 1      Past Medical History  Diagnosis Date  . H/O cystitis   . H/O chlamydia infection 2009  . H/O gonorrhea 2012   . Trichomonas 2009  . History of being obese   . Infection     UTI    Past Surgical History  Procedure Laterality Date  . Wisdom tooth extraction    . No past surgeries      Family History  Problem Relation Age of Onset  . Anesthesia problems Neg Hx   . Depression Mother   . Hypertension Mother   . Hypertension Father   . Diabetes Maternal Aunt   . Cancer Paternal Aunt   . Diabetes Maternal Grandmother     Social History  Substance Use Topics  . Smoking status: Current Every Day Smoker -- 0.25 packs/day    Types: Cigarettes  . Smokeless tobacco: Never Used     Comment: quit recently  . Alcohol Use: No     Comment: ocassionally    Allergies: No Known Allergies  Prescriptions prior to admission  Medication Sig Dispense Refill Last Dose  . ibuprofen (ADVIL,MOTRIN) 600 MG tablet Take 1 tablet (600 mg total) by mouth every 6 (six) hours as needed for cramping. (Patient not taking: Reported on 05/13/2015) 30 tablet 1 Not Taking  . norgestimate-ethinyl estradiol (ORTHO-CYCLEN,SPRINTEC,PREVIFEM) 0.25-35 MG-MCG  tablet Take 1 tablet by mouth daily. (Patient not taking: Reported on 05/13/2015) 1 Package 11     Review of Systems  Constitutional: Negative for fever and malaise/fatigue.  Gastrointestinal: Positive for nausea, vomiting and abdominal pain. Negative for diarrhea and constipation.  Genitourinary: Negative for dysuria, urgency and frequency.       Neg - vaginal bleeding, discharge   Physical Exam   Blood pressure 113/81, pulse 71, temperature 98.1 F (36.7 C), temperature source Oral, resp. rate 16, last menstrual period 08/21/2015, unknown if currently breastfeeding.  Physical Exam  Nursing note and vitals reviewed. Constitutional: She is oriented to person, place, and time. She appears well-developed and well-nourished. No distress.  HENT:  Head: Normocephalic and atraumatic.  Cardiovascular: Normal rate.   Respiratory: Effort normal.  GI: Soft. Bowel sounds are normal. She exhibits no distension and no mass. There is no tenderness. There is no rebound and no guarding.  Neurological: She is alert and oriented to person, place, and time.  Skin: Skin is warm and dry. No erythema.  Psychiatric: She has a normal mood and affect.   Results for orders placed or performed during the hospital encounter of 08/30/15 (from the past 24 hour(s))  Urinalysis, Routine w reflex microscopic (not at [facility])     Status:  Abnormal   Collection Time: 08/30/15  3:30 PM  Result Value Ref Range   Color, Urine YELLOW YELLOW   APPearance CLEAR CLEAR   Specific Gravity, Urine >1.030 (H) 1.005 - 1.030   pH 6.0 5.0 - 8.0   Glucose, UA NEGATIVE NEGATIVE mg/dL   Hgb urine dipstick NEGATIVE NEGATIVE   Bilirubin Urine NEGATIVE NEGATIVE   Ketones, ur NEGATIVE NEGATIVE mg/dL   Protein, ur NEGATIVE NEGATIVE mg/dL   Urobilinogen, UA 1.0 0.0 - 1.0 mg/dL   Nitrite NEGATIVE NEGATIVE   Leukocytes, UA NEGATIVE NEGATIVE  hCG, quantitative, pregnancy     Status: None   Collection Time: 08/30/15  4:00 PM  Result Value  Ref Range   hCG, Beta Chain, Quant, S <1 <5 mIU/mL  CBC with Differential/Platelet     Status: None   Collection Time: 08/30/15  4:00 PM  Result Value Ref Range   WBC 9.4 4.0 - 10.5 K/uL   RBC 4.47 3.87 - 5.11 MIL/uL   Hemoglobin 12.6 12.0 - 15.0 g/dL   HCT 78.2 95.6 - 21.3 %   MCV 83.9 78.0 - 100.0 fL   MCH 28.2 26.0 - 34.0 pg   MCHC 33.6 30.0 - 36.0 g/dL   RDW 08.6 57.8 - 46.9 %   Platelets 365 150 - 400 K/uL   Neutrophils Relative % 50 43 - 77 %   Neutro Abs 4.7 1.7 - 7.7 K/uL   Lymphocytes Relative 38 12 - 46 %   Lymphs Abs 3.6 0.7 - 4.0 K/uL   Monocytes Relative 10 3 - 12 %   Monocytes Absolute 0.9 0.1 - 1.0 K/uL   Eosinophils Relative 2 0 - 5 %   Eosinophils Absolute 0.1 0.0 - 0.7 K/uL   Basophils Relative 0 0 - 1 %   Basophils Absolute 0.0 0.0 - 0.1 K/uL    MAU Course  Procedures None  MDM UPT - negative UA, quant hCG and CBC today 12.5 mg PO Phenergan given. Patient reports improvement in symptoms, no emesis while in MAU Assessment and Plan  A: Negative pregnancy test Viral gastroenteritis  P: Discharge home Rx for Phenergan given to patient Diet for N/V/D discussed Patient advised to follow-up with PCP of choice if symptoms persist or worsen Patient may return to MAU as needed or if her condition were to change or worsen   Marny Lowenstein, PA-C  08/30/2015, 5:45 PM

## 2015-08-30 NOTE — Discharge Instructions (Signed)

## 2015-12-12 IMAGING — US US OB TRANSVAGINAL
1 series · 13 of 28 positions shown · non-contrast
Comparison: None.

CLINICAL DATA: Vaginal bleeding.

EXAM:
OBSTETRIC <14 WK US AND TRANSVAGINAL OB US
TECHNIQUE: Both transabdominal and transvaginal ultrasound examinations were
performed for complete evaluation of the gestation as well as the
maternal uterus, adnexal regions, and pelvic cul-de-sac.
Transvaginal technique was performed to assess early pregnancy.

[Series 1: us ob comp less 14 wks · 57 acquisitions, 13 frames shown]
[im 3/57]
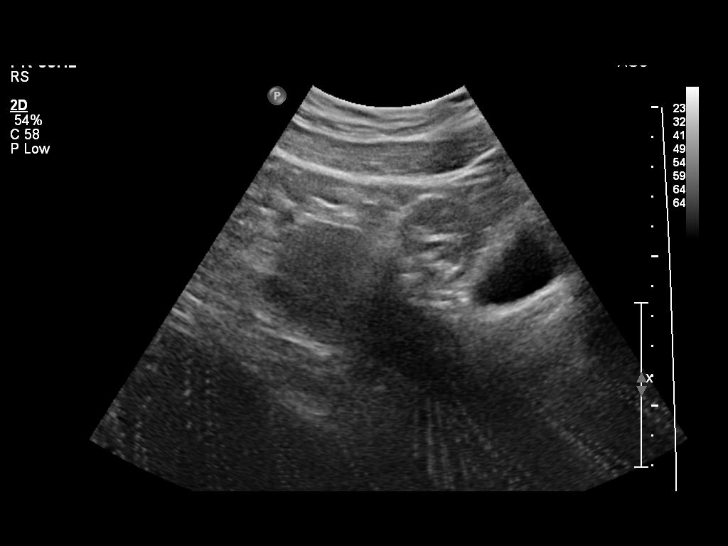
[im 7/57]
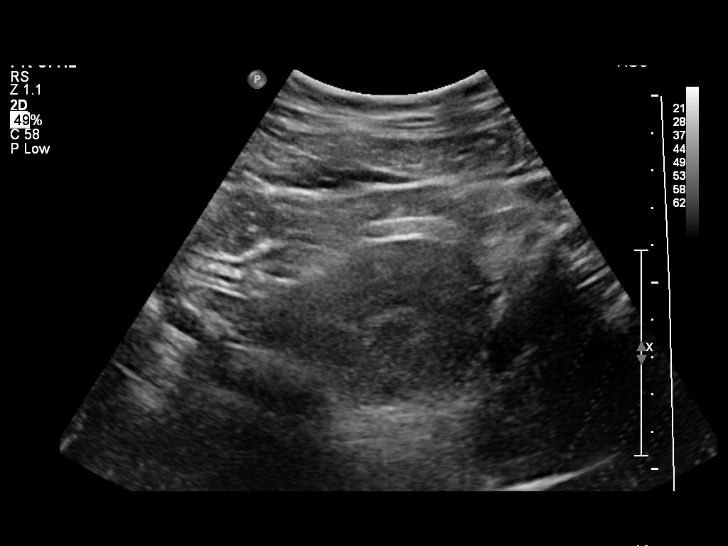
[im 11/57]
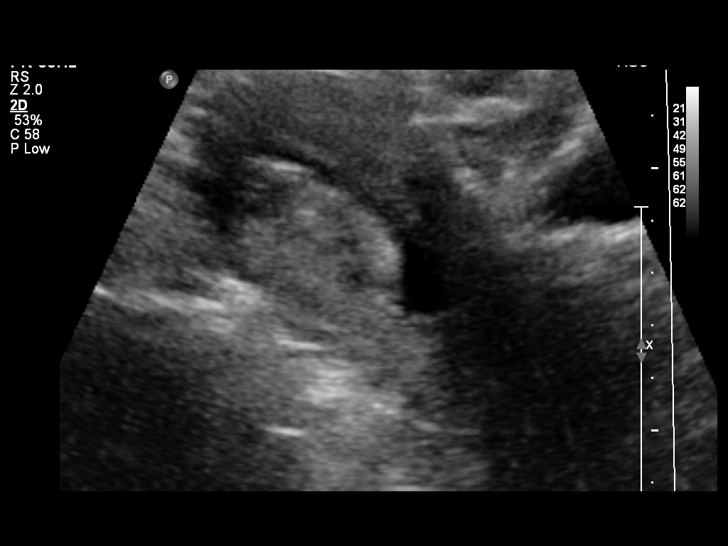
[im 15/57]
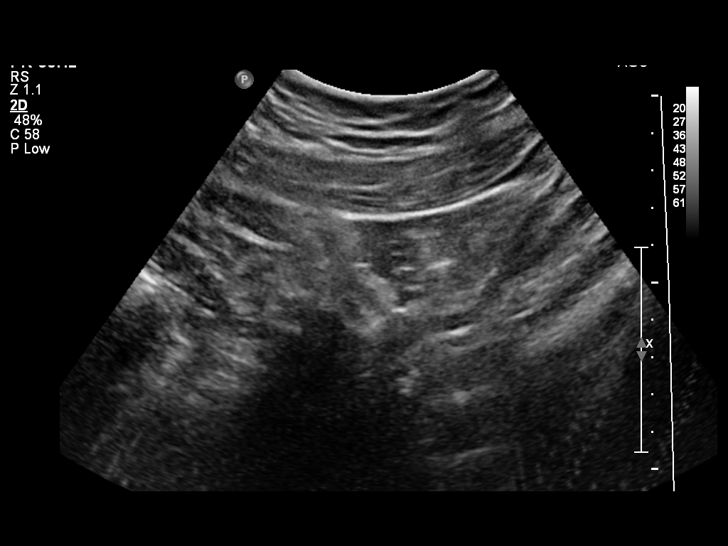
[im 19/57]
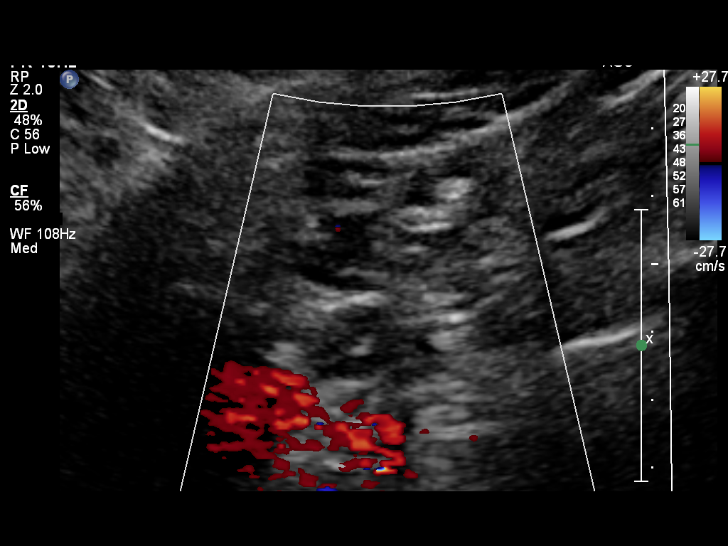
[im 23/57]
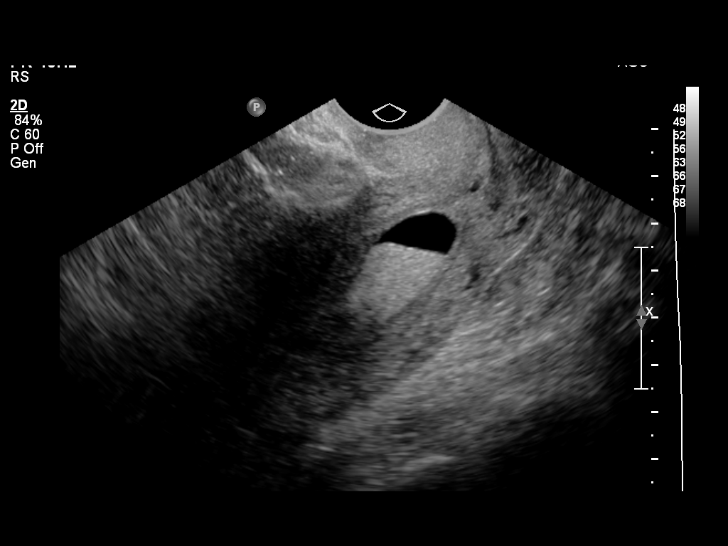
[im 30/57]
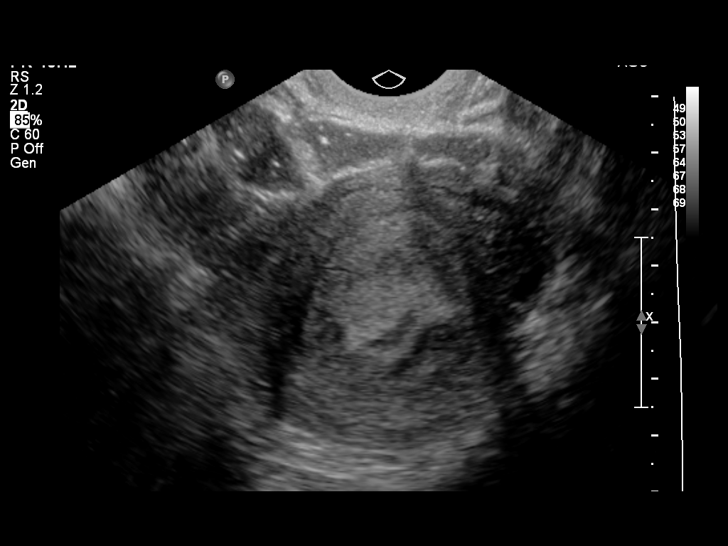
[im 34/57]
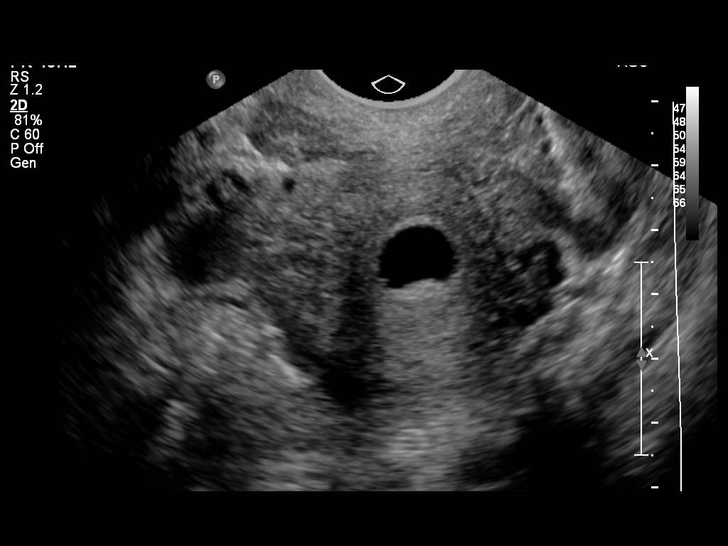
[im 38/57]
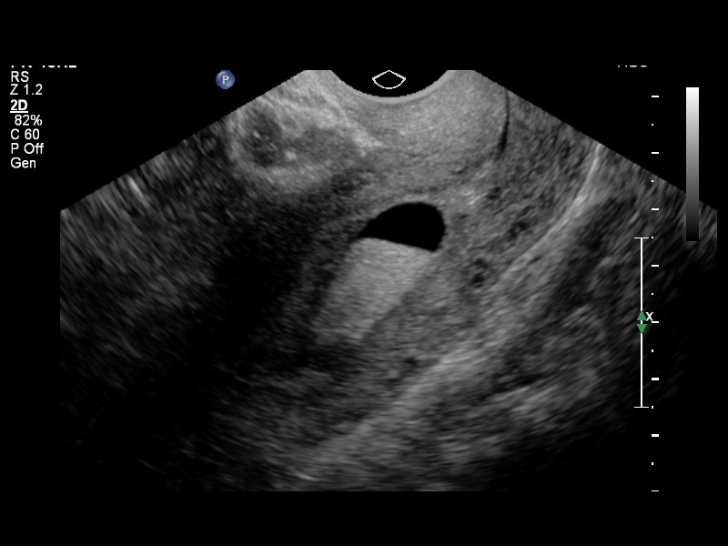
[im 42/57]
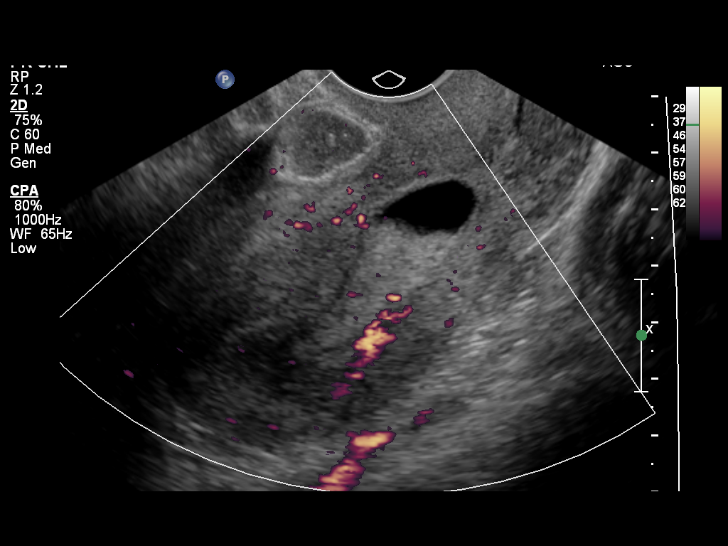
[im 46/57]
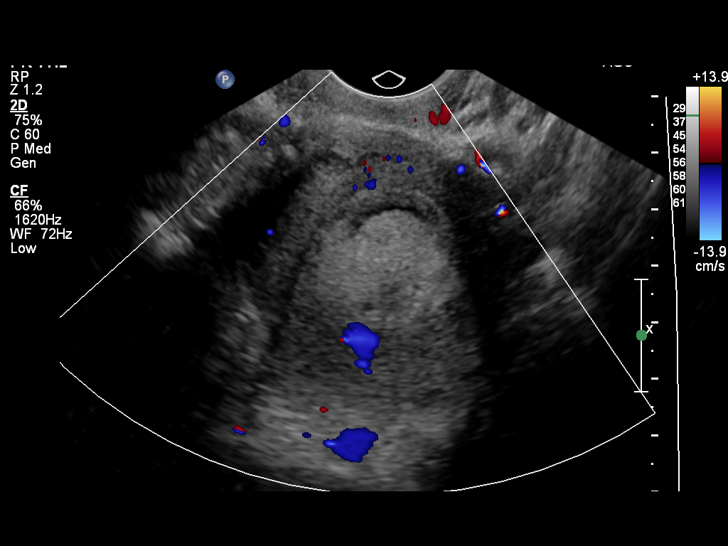
[im 50/57]
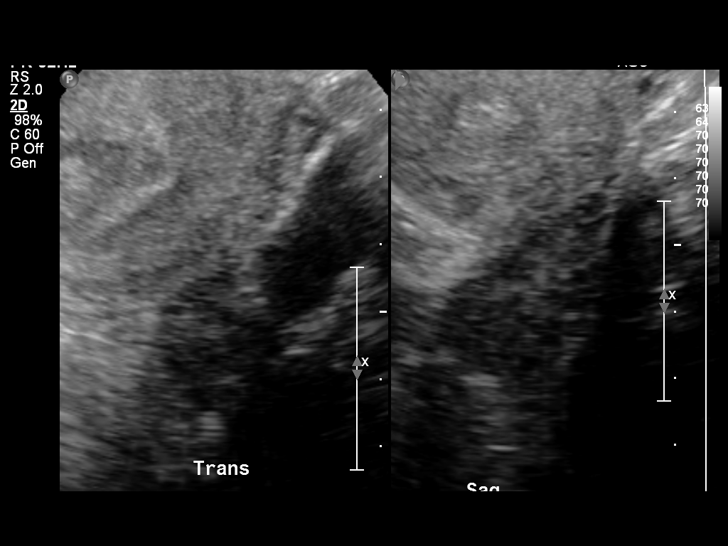
[im 54/57]
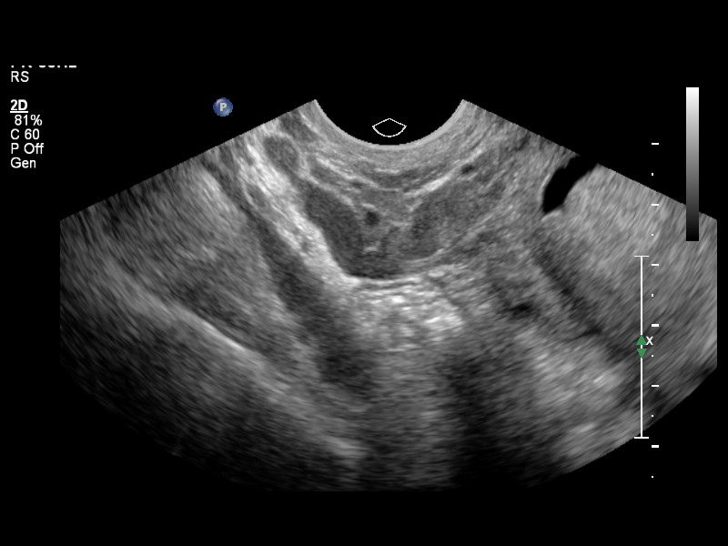

[13 of 28 positions shown; findings below may reference images not displayed]

FINDINGS: Intrauterine gestational sac:  None seen.

Yolk sac:  N/A

Embryo:  N/A

Maternal uterus/adnexae: Complex fluid and clot are seen within the
endometrial canal, compatible with the patient's active vaginal
bleeding. No abnormal focal blood flow seen on limited Doppler
evaluation to suggest retained products of conception.

The ovaries are unremarkable in appearance. The right ovary measures
2.0 x 1.6 x 1.0 cm, while the left ovary measures 3.3 x 1.6 x
cm. No suspicious adnexal masses are seen; there is no evidence for
ovarian torsion.

No free fluid is seen within the pelvic cul-de-sac.
IMPRESSION: 1. No intrauterine gestational sac seen. Findings compatible with
spontaneous abortion.
2. Complex fluid and clot noted filling the endometrial canal,
compatible with the patient's active vaginal bleeding. No evidence
for retained products of conception.

## 2015-12-27 NOTE — L&D Delivery Note (Signed)
25 y.o. G3P1011 at 893w4d delivered a viable female infant in cephalic, LOA position, moderate meconium fluid present. 1 loose nuchal cord, easily delivered through. Right anterior shoulder delivered with ease. 60 sec delayed cord clamping. Cord clamped x2 and cut. Placenta delivered spontaneously intact, with 3VC, meconium stained. Fundus firm on exam with massage and pitocin. Good hemostasis noted.  Laceration: None Suture: N/A Good hemostasis noted. EBL 100 cc  Mom and baby recovering in LDR.    Apgars: 8/9 Weight: pending, skin to skin    Jen MowElizabeth Arthur Aydelotte, DO [facility] 11/23/2016, 7:13 AM

## 2016-03-18 ENCOUNTER — Inpatient Hospital Stay (HOSPITAL_COMMUNITY)
Admission: AD | Admit: 2016-03-18 | Discharge: 2016-03-18 | Disposition: A | Discharge status: Home / Self Care | Payer: 59 | Source: Ambulatory Visit | Attending: Obstetrics & Gynecology | Admitting: Obstetrics & Gynecology

## 2016-03-18 DIAGNOSIS — N912 Amenorrhea, unspecified: Secondary | ICD-10-CM | POA: Diagnosis present

## 2016-03-18 NOTE — MAU Note (Signed)
Patient presents with positive home pregnancy test x 2, LMP 02/13/16, confirmation of pregnancy, history of previous miscarriage, denies pain, no vaginal bleeding, no discharge.

## 2016-03-18 NOTE — MAU Provider Note (Signed)
[name] is a 25 y.o. G3P1011 at Unknown who presents to MAU today for pregnancy verification. The patient denies abdominal pain or vaginal bleeding today.   BP 124/75 mmHg  Pulse 73  Temp(Src) 98.4 F (36.9 C) (Oral)  Resp 18  Ht 5\' 5"  (1.651 m)  Wt 245 lb 12.8 oz (111.494 kg)  BMI 40.90 kg/m2  CONSTITUTIONAL: Well-developed, well-nourished female in no acute distress.  CARDIOVASCULAR: Regular heart rate RESPIRATORY: Normal effort NEUROLOGICAL: Alert and oriented to person, place, and time.  SKIN: Skin is warm and dry. No rash noted. Not diaphoretic. No erythema. No pallor. PSYCH: Normal mood and affect. Normal behavior. Normal judgment and thought content.  MDM Medical Screen Exam Complete  A: Amenorrhea   P: Discharge from MAU Patient advised to follow-up with [facility] for pregnancy confirmation  Monday-Thursday 8am-4pm or Friday 8am-11am Patient may return to MAU as needed or if her condition were to change or worsen   Hurshel PartyLisa A Leftwich-Kirby, CNM  03/18/2016 4:56 PM

## 2016-03-28 ENCOUNTER — Inpatient Hospital Stay (HOSPITAL_COMMUNITY)
Admission: AD | Admit: 2016-03-28 | Discharge: 2016-03-28 | Disposition: A | Discharge status: Home / Self Care | Payer: 59 | Source: Ambulatory Visit | Attending: Family Medicine | Admitting: Family Medicine

## 2016-03-28 ENCOUNTER — Encounter (HOSPITAL_COMMUNITY): Payer: Self-pay | Admitting: *Deleted

## 2016-03-28 DIAGNOSIS — K219 Gastro-esophageal reflux disease without esophagitis: Secondary | ICD-10-CM | POA: Diagnosis not present

## 2016-03-28 DIAGNOSIS — N760 Acute vaginitis: Secondary | ICD-10-CM | POA: Insufficient documentation

## 2016-03-28 DIAGNOSIS — Z818 Family history of other mental and behavioral disorders: Secondary | ICD-10-CM | POA: Diagnosis not present

## 2016-03-28 DIAGNOSIS — Z87440 Personal history of urinary (tract) infections: Secondary | ICD-10-CM | POA: Insufficient documentation

## 2016-03-28 DIAGNOSIS — O23591 Infection of other part of genital tract in pregnancy, first trimester: Secondary | ICD-10-CM

## 2016-03-28 DIAGNOSIS — R109 Unspecified abdominal pain: Secondary | ICD-10-CM | POA: Insufficient documentation

## 2016-03-28 DIAGNOSIS — O26851 Spotting complicating pregnancy, first trimester: Secondary | ICD-10-CM | POA: Diagnosis present

## 2016-03-28 DIAGNOSIS — B9689 Other specified bacterial agents as the cause of diseases classified elsewhere: Secondary | ICD-10-CM

## 2016-03-28 DIAGNOSIS — Z3A01 Less than 8 weeks gestation of pregnancy: Secondary | ICD-10-CM | POA: Diagnosis not present

## 2016-03-28 DIAGNOSIS — A499 Bacterial infection, unspecified: Secondary | ICD-10-CM

## 2016-03-28 DIAGNOSIS — Z87891 Personal history of nicotine dependence: Secondary | ICD-10-CM | POA: Diagnosis not present

## 2016-03-28 DIAGNOSIS — Z8249 Family history of ischemic heart disease and other diseases of the circulatory system: Secondary | ICD-10-CM | POA: Diagnosis not present

## 2016-03-28 DIAGNOSIS — Z833 Family history of diabetes mellitus: Secondary | ICD-10-CM | POA: Diagnosis not present

## 2016-03-28 DIAGNOSIS — Z809 Family history of malignant neoplasm, unspecified: Secondary | ICD-10-CM | POA: Diagnosis not present

## 2016-03-28 DIAGNOSIS — Z3201 Encounter for pregnancy test, result positive: Secondary | ICD-10-CM | POA: Insufficient documentation

## 2016-03-28 DIAGNOSIS — Z8619 Personal history of other infectious and parasitic diseases: Secondary | ICD-10-CM | POA: Insufficient documentation

## 2016-03-28 LAB — COMPREHENSIVE METABOLIC PANEL
ALK PHOS: 37 U/L — AB (ref 38–126)
ALT: 15 U/L (ref 14–54)
AST: 15 U/L (ref 15–41)
Albumin: 3.5 g/dL (ref 3.5–5.0)
Anion gap: 6 (ref 5–15)
BUN: 15 mg/dL (ref 6–20)
CALCIUM: 8.4 mg/dL — AB (ref 8.9–10.3)
CO2: 23 mmol/L (ref 22–32)
CREATININE: 0.89 mg/dL (ref 0.44–1.00)
Chloride: 102 mmol/L (ref 101–111)
Glucose, Bld: 99 mg/dL (ref 65–99)
Potassium: 3.4 mmol/L — ABNORMAL LOW (ref 3.5–5.1)
SODIUM: 131 mmol/L — AB (ref 135–145)
TOTAL PROTEIN: 7.1 g/dL (ref 6.5–8.1)
Total Bilirubin: 0.2 mg/dL — ABNORMAL LOW (ref 0.3–1.2)

## 2016-03-28 LAB — URINE MICROSCOPIC-ADD ON

## 2016-03-28 LAB — URINALYSIS, ROUTINE W REFLEX MICROSCOPIC
BILIRUBIN URINE: NEGATIVE
Glucose, UA: NEGATIVE mg/dL
HGB URINE DIPSTICK: NEGATIVE
KETONES UR: NEGATIVE mg/dL
NITRITE: NEGATIVE
PH: 6 (ref 5.0–8.0)
Protein, ur: NEGATIVE mg/dL

## 2016-03-28 LAB — WET PREP, GENITAL
Sperm: NONE SEEN
Trich, Wet Prep: NONE SEEN
Yeast Wet Prep HPF POC: NONE SEEN

## 2016-03-28 LAB — CBC
HEMATOCRIT: 36.6 % (ref 36.0–46.0)
Hemoglobin: 12.5 g/dL (ref 12.0–15.0)
MCH: 28.3 pg (ref 26.0–34.0)
MCHC: 34.2 g/dL (ref 30.0–36.0)
MCV: 83 fL (ref 78.0–100.0)
Platelets: 316 10*3/uL (ref 150–400)
RBC: 4.41 MIL/uL (ref 3.87–5.11)
RDW: 15.4 % (ref 11.5–15.5)
WBC: 10.1 10*3/uL (ref 4.0–10.5)

## 2016-03-28 LAB — POCT PREGNANCY, URINE: PREG TEST UR: POSITIVE — AB

## 2016-03-28 MED ORDER — METRONIDAZOLE 500 MG PO TABS: 500.0000 mg | ORAL_TABLET | Freq: Two times a day (BID) | ORAL | Status: DC

## 2016-03-28 MED ORDER — GI COCKTAIL ~~LOC~~
30.0000 mL | Freq: Once | ORAL | Status: AC
  Administered 2016-03-28: 30 mL via ORAL
  Filled 2016-03-28: qty 30

## 2016-03-28 MED ORDER — RANITIDINE HCL 150 MG PO TABS
150.0000 mg | ORAL_TABLET | Freq: Two times a day (BID) | ORAL | Status: DC
Start: 2016-03-28 — End: 2016-06-07

## 2016-03-28 NOTE — Discharge Instructions (Signed)
Bacterial Vaginosis Bacterial vaginosis is an infection of the vagina. It happens when too many germs (bacteria) grow in the vagina. Having this infection puts you at risk for getting other infections from sex. Treating this infection can help lower your risk for other infections, such as:   Chlamydia.  Gonorrhea.  HIV.  Herpes. HOME CARE  Take your medicine as told by your doctor.  Finish your medicine even if you start to feel better.  Tell your sex partner that you have an infection. They should see their doctor for treatment.  During treatment:  Avoid sex or use condoms correctly.  Do not douche.  Do not drink alcohol unless your doctor tells you it is ok.  Do not breastfeed unless your doctor tells you it is ok. GET HELP IF:  You are not getting better after 3 days of treatment.  You have more grey fluid (discharge) coming from your vagina than before.  You have more pain than before.  You have a fever. MAKE SURE YOU:   Understand these instructions.  Will watch your condition.  Will get help right away if you are not doing well or get worse.   This information is not intended to replace advice given to you by your health care provider. Make sure you discuss any questions you have with your health care provider.   Document Released: 09/20/2008 Document Revised: 01/02/2015 Document Reviewed: 07/24/2013 Elsevier Interactive Patient Education 2016 [employer] Food Choices for Gastroesophageal Reflux Disease, Adult When you have gastroesophageal reflux disease (GERD), the foods you eat and your eating habits are very important. Choosing the right foods can help ease the discomfort of GERD. WHAT GENERAL GUIDELINES DO I NEED TO FOLLOW?  Choose fruits, vegetables, whole grains, low-fat dairy products, and low-fat meat, fish, and poultry.  Limit fats such as oils, salad dressings, butter, nuts, and avocado.  Keep a food diary to identify foods that cause  symptoms.  Avoid foods that cause reflux. These may be different for different people.  Eat frequent small meals instead of three large meals each day.  Eat your meals slowly, in a relaxed setting.  Limit fried foods.  Cook foods using methods other than frying.  Avoid drinking alcohol.  Avoid drinking large amounts of liquids with your meals.  Avoid bending over or lying down until 2-3 hours after eating. WHAT FOODS ARE NOT RECOMMENDED? The following are some foods and drinks that may worsen your symptoms: Vegetables Tomatoes. Tomato juice. Tomato and spaghetti sauce. Chili peppers. Onion and garlic. Horseradish. Fruits Oranges, grapefruit, and lemon (fruit and juice). Meats High-fat meats, fish, and poultry. This includes hot dogs, ribs, ham, sausage, salami, and bacon. Dairy Whole milk and chocolate milk. Sour cream. Cream. Butter. Ice cream. Cream cheese.  Beverages Coffee and tea, with or without caffeine. Carbonated beverages or energy drinks. Condiments Hot sauce. Barbecue sauce.  Sweets/Desserts Chocolate and cocoa. Donuts. Peppermint and spearmint. Fats and Oils High-fat foods, including [place] fries and potato chips. Other Vinegar. Strong spices, such as black pepper, white pepper, red pepper, cayenne, curry powder, cloves, ginger, and chili powder. The items listed above may not be a complete list of foods and beverages to avoid. Contact your dietitian for more information.   This information is not intended to replace advice given to you by your health care provider. Make sure you discuss any questions you have with your health care provider.   Document Released: 12/12/2005 Document Revised: 01/02/2015 Document Reviewed: 10/16/2013 Elsevier Interactive Patient  Education ©2016 Elsevier Inc. ° °

## 2016-03-28 NOTE — MAU Note (Signed)
Started spotting yesterday with wiping. Not wearing pads.  Abdominal pain started last week and in upper abdomen.

## 2016-03-28 NOTE — MAU Provider Note (Signed)
History     CSN: [id number]  Arrival date and time: 03/28/16 0907   First Provider Initiated Contact with Patient 03/28/16 [phone]      Chief Complaint  Patient presents with  . Vaginal Bleeding  . Abdominal Pain   HPI   [name] is a 25 y.o. female G72P1011 @ 33w2dpresenting to MAU with spotting (none now) and upper abdominal pain.   The upper abdominal pain started last week; the pain comes and goes. The pain feels like cramping and gassy at the same time. She eats a lot of fried foods and fast foods.  Eating makes the pain worse. Nothing makes it better. She currently rates her upper abdominal pain 2/10. She denies lower abdominal pain.   [facility] [place] [facility] [place] [facility] [place]   OB History    Gravida Para Term Preterm AB TAB SAB Ectopic Multiple Living   _0 0 1 0 1 0 0 1      Past Medical History  Diagnosis Date  . H/O cystitis   . H/O chlamydia infection 2009  . H/O gonorrhea 2012   . Trichomonas 2009  . History of being obese   . Infection     UTI    Past Surgical History  Procedure Laterality Date  . Wisdom tooth extraction    . No past surgeries      Family History  Problem Relation Age of Onset  . Anesthesia problems Neg Hx   . Depression Mother   . Hypertension Mother   . Hypertension Father   . Diabetes Maternal Aunt   . Cancer Paternal Aunt   . Diabetes Maternal Grandmother     Social History  Substance Use Topics  . Smoking status: Former Smoker -- 0.25 packs/day    Types: Cigarettes    Quit date: 02/13/2016  . Smokeless tobacco: Never Used     Comment: quit recently  . Alcohol Use: No     Comment: ocassionally    Allergies: No Known Allergies  Prescriptions prior to admission  Medication Sig Dispense Refill Last Dose  . ibuprofen (ADVIL,MOTRIN) 600 MG tablet Take 1 tablet (600 mg total) by mouth every 6 (six) hours as needed for cramping. (Patient not taking: Reported on 05/13/2015) 30 tablet 1 Not Taking  .  norgestimate-ethinyl estradiol (ORTHO-CYCLEN,SPRINTEC,PREVIFEM) 0.25-35 MG-MCG tablet Take 1 tablet by mouth daily. (Patient not taking: Reported on 05/13/2015) 1 Package 11   . promethazine (PHENERGAN) 12.5 MG tablet Take 1 tablet (12.5 mg total) by mouth every 6 (six) hours as needed for nausea or vomiting. 30 tablet 0    Results for orders placed or performed during the hospital encounter of 03/28/16 (from the past 48 hour(s))  Urinalysis, [facility] w reflex [facility]     Status: Abnormal   Collection Time: 03/28/16  9:35 AM  Result Value Ref Range   Color, Urine YELLOW YELLOW   APPearance HAZY (A) CLEAR   Specific Gravity, Urine >1.030 (H) 1.005 - 1.030   pH 6.0 5.0 - 8.0   Glucose, UA NEGATIVE NEGATIVE mg/dL   Hgb urine dipstick NEGATIVE NEGATIVE   Bilirubin Urine NEGATIVE NEGATIVE   Ketones, ur NEGATIVE NEGATIVE mg/dL   Protein, ur NEGATIVE NEGATIVE mg/dL   Nitrite NEGATIVE NEGATIVE   Leukocytes, UA TRACE (A) NEGATIVE  Urine microscopic-add on     Status: Abnormal   Collection Time: 03/28/16  9:35 AM  Result Value Ref Range   Squamous Epithelial /  LPF TOO NUMEROUS TO COUNT (A) NONE SEEN   WBC, UA 0-5 0 - 5 WBC/hpf   RBC / HPF 0-5 0 - 5 RBC/hpf   Bacteria, UA MANY (A) NONE SEEN  Pregnancy, urine POC     Status: Abnormal   Collection Time: 03/28/16  9:39 AM  Result Value Ref Range   Preg Test, Ur POSITIVE (A) NEGATIVE    Comment:        THE SENSITIVITY OF THIS METHODOLOGY IS >24 mIU/mL   Wet prep, genital     Status: Abnormal   Collection Time: 03/28/16  9:50 AM  Result Value Ref Range   Yeast Wet Prep HPF POC NONE SEEN NONE SEEN   Trich, Wet Prep NONE SEEN NONE SEEN   Clue Cells Wet Prep HPF POC PRESENT (A) NONE SEEN   WBC, Wet Prep HPF POC MODERATE (A) NONE SEEN    Comment: MANY BACTERIA SEEN   Sperm NONE SEEN   Comprehensive metabolic panel     Status: Abnormal   Collection Time: 03/28/16  9:53 AM  Result Value Ref Range   Sodium 131 (L) 135 - 145  mmol/L   Potassium 3.4 (L) 3.5 - 5.1 mmol/L   Chloride 102 101 - 111 mmol/L   CO2 23 22 - 32 mmol/L   Glucose, Bld 99 65 - 99 mg/dL   BUN 15 6 - 20 mg/dL   Creatinine, Ser 0.89 0.44 - 1.00 mg/dL   Calcium 8.4 (L) 8.9 - 10.3 mg/dL   Total Protein 7.1 6.5 - 8.1 g/dL   Albumin 3.5 3.5 - 5.0 g/dL   AST 15 15 - 41 U/L   ALT 15 14 - 54 U/L   Alkaline Phosphatase 37 (L) 38 - 126 U/L   Total Bilirubin 0.2 (L) 0.3 - 1.2 mg/dL   GFR calc non Af Amer >60 >60 mL/min   GFR calc Af Amer >60 >60 mL/min    Comment: (NOTE) The eGFR has been calculated using the CKD EPI equation. This calculation has not been validated in all clinical situations. eGFR's persistently <60 mL/min signify possible Chronic Kidney Disease.    Anion gap 6 5 - 15  CBC     Status: None   Collection Time: 03/28/16  9:53 AM  Result Value Ref Range   WBC 10.1 4.0 - 10.5 K/uL   RBC 4.41 3.87 - 5.11 MIL/uL   Hemoglobin 12.5 12.0 - 15.0 g/dL   HCT 36.6 36.0 - 46.0 %   MCV 83.0 78.0 - 100.0 fL   MCH 28.3 26.0 - 34.0 pg   MCHC 34.2 30.0 - 36.0 g/dL   RDW 15.4 11.5 - 15.5 %   Platelets 316 150 - 400 K/uL    Review of Systems  Constitutional: Negative for fever and chills.  Gastrointestinal: Positive for heartburn, nausea and abdominal pain. Negative for vomiting, diarrhea (1 episode of diarrhea. ) and constipation.  Genitourinary: Negative for dysuria, urgency and frequency.   Physical Exam   Blood pressure 121/63, pulse 70, temperature 98.7 F (37.1 C), temperature source Oral, resp. rate 18, height '5\' 5"'$  (1.651 m), last menstrual period 02/13/2016, unknown if currently breastfeeding.  Physical Exam  Constitutional: She is oriented to person, place, and time. She appears well-developed and well-nourished. No distress.  HENT:  Head: Normocephalic.  Eyes: Pupils are equal, round, and reactive to light.  GI: There is tenderness in the epigastric area and periumbilical area. There is no rigidity, no rebound, no guarding  and negative  Murphy's sign.  Genitourinary:  Speculum exam: Vagina - Small amount of creamy discharge, mild odor Cervix - No contact bleeding, no blood noted  Bimanual exam: Cervix closed Uterus non tender, normal size Adnexa non tender, no masses bilaterally GC/Chlam, wet prep done Chaperone present for exam.  Musculoskeletal: Normal range of motion.  Neurological: She is alert and oriented to person, place, and time.  Skin: Skin is warm. She is not diaphoretic.  Psychiatric: Her behavior is normal.    MAU Course  Procedures  None  MDM  Wet prep GC Gi cocktail  CBC CMP  Minimal relief from Gi Cocktail; patient would like something to take at home for upper abdominal discomfort/ GERD   Assessment and Plan   A:  1. Bacterial vaginosis   2. Gastroesophageal reflux disease, esophagitis presence not specified    P:  Discharge home in stable condition RX: Flagyl, Zantac Return to MAU if symptoms worsen Start prenatal care ASAP First trimester warning signs discussed.  Avoid fried, fatty foods. Increase po fluid intake.   Lezlie Lye, NP 03/28/2016 1:44 PM

## 2016-03-29 LAB — GC/CHLAMYDIA PROBE AMP (~~LOC~~) NOT AT ARMC
Chlamydia: NEGATIVE
NEISSERIA GONORRHEA: NEGATIVE

## 2016-05-03 ENCOUNTER — Encounter (HOSPITAL_COMMUNITY): Payer: Self-pay | Admitting: *Deleted

## 2016-05-03 ENCOUNTER — Inpatient Hospital Stay (HOSPITAL_COMMUNITY)
Admission: AD | Admit: 2016-05-03 | Discharge: 2016-05-03 | Disposition: A | Discharge status: Home / Self Care | Payer: 59 | Source: Ambulatory Visit | Attending: Obstetrics & Gynecology | Admitting: Obstetrics & Gynecology

## 2016-05-03 DIAGNOSIS — O99611 Diseases of the digestive system complicating pregnancy, first trimester: Secondary | ICD-10-CM

## 2016-05-03 DIAGNOSIS — Z87891 Personal history of nicotine dependence: Secondary | ICD-10-CM | POA: Insufficient documentation

## 2016-05-03 DIAGNOSIS — R1032 Left lower quadrant pain: Secondary | ICD-10-CM | POA: Diagnosis present

## 2016-05-03 DIAGNOSIS — Z36 Encounter for antenatal screening of mother: Secondary | ICD-10-CM | POA: Diagnosis not present

## 2016-05-03 DIAGNOSIS — O26891 Other specified pregnancy related conditions, first trimester: Secondary | ICD-10-CM | POA: Insufficient documentation

## 2016-05-03 DIAGNOSIS — K5900 Constipation, unspecified: Secondary | ICD-10-CM | POA: Diagnosis not present

## 2016-05-03 DIAGNOSIS — Z3A11 11 weeks gestation of pregnancy: Secondary | ICD-10-CM | POA: Insufficient documentation

## 2016-05-03 DIAGNOSIS — O26899 Other specified pregnancy related conditions, unspecified trimester: Secondary | ICD-10-CM

## 2016-05-03 LAB — URINALYSIS, ROUTINE W REFLEX MICROSCOPIC
Bilirubin Urine: NEGATIVE
GLUCOSE, UA: NEGATIVE mg/dL
Hgb urine dipstick: NEGATIVE
Ketones, ur: NEGATIVE mg/dL
Nitrite: NEGATIVE
Protein, ur: NEGATIVE mg/dL
Specific Gravity, Urine: 1.025 (ref 1.005–1.030)
pH: 6 (ref 5.0–8.0)

## 2016-05-03 LAB — URINE MICROSCOPIC-ADD ON

## 2016-05-03 MED ORDER — POLYETHYLENE GLYCOL 3350 17 G PO PACK
17.0000 g | PACK | Freq: Every day | ORAL | Status: DC
Start: 2016-05-03 — End: 2016-06-07

## 2016-05-03 NOTE — Discharge Instructions (Signed)

## 2016-05-03 NOTE — MAU Note (Addendum)
Pt reports pain in her L side off and on since Sunday. Pt states that the pain has not gone away since last night, now the pain is in her back also. Pt Has a consult w/ [facility] tomorrow and OB appt on June 15th. Pt has not had a BM in 2 days. Pt was nervous to take anything for pain because of the pregnancy.

## 2016-05-03 NOTE — MAU Note (Signed)
Having pain LLQ off and on since Sunday night. This am started more in lower back as well as LLQ. Have appt with [facility] tomorrow but has not seen them since 2013. No bleeding

## 2016-05-03 NOTE — MAU Provider Note (Signed)
Chief Complaint:  Back Pain   First Provider Initiated Contact with Patient 05/03/16 1348     HPI  Jodi Mcdonald is a 25 y.o. G3P1011 at 3511w3dwho presents to maternity admissions reporting Left lower abdominal pain for the past 2 days.  Has been constipated, was afraid to try anything for that.  [facility]. . She denies LOF, vaginal bleeding, vaginal itching/burning, urinary symptoms, h/a, dizziness, n/v, diarrhea, or fever/chills.  She denies headache, visual changes or RUQ abdominal pain.  RN Note: Pt reports pain in her L side off and on since Sunday. Pt states that the pain has not gone away since last night, now the pain is in her back also. Pt Has a consult w/ [facility] tomorrow and OB appt on June 15th. Pt has not had a BM in 2 days. Pt was nervous to take anything for pain because of the pregnancy  Past Medical History: Past Medical History  Diagnosis Date  . H/O cystitis   . H/O chlamydia infection 2009  . H/O gonorrhea 2012   . Trichomonas 2009  . History of being obese   . Infection     UTI    Past obstetric history: OB History  Gravida Para Term Preterm AB SAB TAB Ectopic Multiple Living  3 1 1  0 1 1 0 0 0 1    # Outcome Date GA Lbr Len/2nd Weight Sex Delivery Anes PTL Lv  3 Current           2 Term 03/06/12 4967w6d / 00:21 7 lb 4.9 oz (3.314 kg) M Vag-Spont Local,EPI  Y     Comments: WNL  1 SAB               Past Surgical History: Past Surgical History  Procedure Laterality Date  . Wisdom tooth extraction    . No past surgeries      Family History: Family History  Problem Relation Age of Onset  . Anesthesia problems Neg Hx   . Depression Mother   . Hypertension Mother   . Hypertension Father   . Diabetes Maternal Aunt   . Cancer Paternal Aunt   . Diabetes Maternal Grandmother     Social History: Social History  Substance Use Topics  . Smoking status: Former Smoker -- 0.25 packs/day    Types: Cigarettes    Quit date: 02/13/2016  .  Smokeless tobacco: Never Used     Comment: quit recently  . Alcohol Use: No     Comment: ocassionally    Allergies: No Known Allergies  Meds:  Prescriptions prior to admission  Medication Sig Dispense Refill Last Dose  . Prenatal Vit-Fe Fumarate-FA (PRENATAL MULTIVITAMIN) TABS tablet Take 1 tablet by mouth daily at 12 noon.   05/03/2016 at Unknown time  . metroNIDAZOLE (FLAGYL) 500 MG tablet Take 1 tablet (500 mg total) by mouth 2 (two) times daily. 14 tablet 0   . ranitidine (ZANTAC) 150 MG tablet Take 1 tablet (150 mg total) by mouth 2 (two) times daily. 60 tablet 1 More than a month at Unknown time    I have reviewed patient's Past Medical Hx, Surgical Hx, Family Hx, Social Hx, medications and allergies.   ROS:  Review of Systems  Constitutional: Negative for fever and chills.  Respiratory: Negative for shortness of breath.   Gastrointestinal: Positive for abdominal pain and constipation. Negative for nausea, vomiting, diarrhea, blood in stool and abdominal distention.  Genitourinary: Negative for dysuria, flank pain, vaginal bleeding, vaginal  discharge and pelvic pain.  Musculoskeletal: Negative for back pain and neck pain.  Neurological: Negative for dizziness.   Other systems negative  Physical Exam  Patient Vitals for the past 24 hrs:  BP Temp Pulse Resp Height Weight  05/03/16 1217 128/74 mmHg - 84 - - -  05/03/16 1216 - 98.4 F (36.9 C) - 18 5\' 6"  (1.676 m) 253 lb 9.6 oz (115.032 kg)   Constitutional: Well-developed, well-nourished female in no acute distress.  Cardiovascular: normal rate and rhythm Respiratory: normal effort, clear to auscultation bilaterally GI: Abd soft, non-tender except for mild tenderness LLQ with deep palpation, gravid appropriate for gestational age.   No rebound or guarding. MS: Extremities nontender, no edema, normal ROM Neurologic: Alert and oriented x 4.  GU: Neg CVAT.  PELVIC EXAM: Cervix firm, posterior, neg CMT, uterus nontender,  Fundal Height consistent with dates, adnexa without tenderness, enlargement, or mass       Labs: Results for orders placed or performed during the hospital encounter of 05/03/16 (from the past 24 hour(s))  Urinalysis, Routine w reflex microscopic (not at [facility])     Status: Abnormal   Collection Time: 05/03/16 12:05 PM  Result Value Ref Range   Color, Urine YELLOW YELLOW   APPearance CLEAR CLEAR   Specific Gravity, Urine 1.025 1.005 - 1.030   pH 6.0 5.0 - 8.0   Glucose, UA NEGATIVE NEGATIVE mg/dL   Hgb urine dipstick NEGATIVE NEGATIVE   Bilirubin Urine NEGATIVE NEGATIVE   Ketones, ur NEGATIVE NEGATIVE mg/dL   Protein, ur NEGATIVE NEGATIVE mg/dL   Nitrite NEGATIVE NEGATIVE   Leukocytes, UA SMALL (A) NEGATIVE  Urine microscopic-add on     Status: Abnormal   Collection Time: 05/03/16 12:05 PM  Result Value Ref Range   Squamous Epithelial / LPF 6-30 (A) NONE SEEN   WBC, UA 0-5 0 - 5 WBC/hpf   RBC / HPF 0-5 0 - 5 RBC/hpf   Bacteria, UA FEW (A) NONE SEEN      Imaging:  Bedside US done by me:  Single IUP seen in uterus                                               Gestational sac normal in shape                                               Yolk sac visible                                                Single fetus, active, appears normal                                                CRL c/w [redacted]w[redacted]d  Ovaries not examined, as I don't have a vaginal probe  MAU Course/MDM: I have ordered labs and reviewed results.  Cervical exam done, closed and long Bedside US done to confirm live fetus Discussed constipation and possible corpus luteum cyst as possible etiologies of pain Discussed increased water and fiber intake for prevention of constipation  Assessment: SIUP at [redacted]w[redacted]d  Left lower quadrant pain, unknown etiology Constipation  Plan: Discharge home Discussed using Miralax for constipation if needed Followup in office as  scheduled    Medication List    ASK your doctor about these medications        metroNIDAZOLE 500 MG tablet  Commonly known as:  FLAGYL  Take 1 tablet (500 mg total) by mouth 2 (two) times daily.     prenatal multivitamin Tabs tablet  Take 1 tablet by mouth daily at 12 noon.     ranitidine 150 MG tablet  Commonly known as:  ZANTAC  Take 1 tablet (150 mg total) by mouth 2 (two) times daily.       Pt stable at time of discharge.\  Encouraged to return here or to other Urgent Care/ED if she develops worsening of symptoms, increase in pain, fever, or other concerning symptoms.      Wynelle Bourgeois CNM, MSN Certified Nurse-Midwife 05/03/2016 2:09 PM

## 2016-06-07 ENCOUNTER — Ambulatory Visit (INDEPENDENT_AMBULATORY_CARE_PROVIDER_SITE_OTHER): Payer: 59 | Admitting: Certified Nurse Midwife

## 2016-06-07 VITALS — BP 120/71 | HR 88 | Wt 252.0 lb

## 2016-06-07 DIAGNOSIS — Z3482 Encounter for supervision of other normal pregnancy, second trimester: Secondary | ICD-10-CM

## 2016-06-07 LAB — POCT URINALYSIS DIPSTICK
BILIRUBIN UA: NEGATIVE
Blood, UA: NEGATIVE
Glucose, UA: NEGATIVE
KETONES UA: NEGATIVE
Nitrite, UA: NEGATIVE
PH UA: 5.5
SPEC GRAV UA: 1.015
Urobilinogen, UA: NEGATIVE

## 2016-06-07 MED ORDER — PRIMACARE 30-1-470 MG PO CAPS: 1.0000 | ORAL_CAPSULE | Freq: Every day | ORAL | Status: DC

## 2016-06-07 NOTE — Progress Notes (Signed)
Subjective:    Jodi Mcdonald is being seen today for her first obstetrical visit.  This is not a planned pregnancy. She is at 4273w3d gestation. Her obstetrical history is significant for obesity. Relationship with FOB: significant other, living together. Patient does intend to breast feed. Pregnancy history fully reviewed.  The information documented in the HPI was reviewed and verified.  Menstrual History: OB History    Gravida Para Term Preterm AB TAB SAB Ectopic Multiple Living   3 1 1  0 1 0 1 0 0 1     Term, vaginal delivery, 7#5oz.  Patient's last menstrual period was 02/13/2016 (exact date).    Past Medical History  Diagnosis Date  . H/O cystitis   . H/O chlamydia infection 2009  . H/O gonorrhea 2012   . Trichomonas 2009  . History of being obese   . Infection     UTI    Past Surgical History  Procedure Laterality Date  . Wisdom tooth extraction    . No past surgeries       (Not in a hospital admission) No Known Allergies  Social History  Substance Use Topics  . Smoking status: Former Smoker -- 0.25 packs/day    Types: Cigarettes    Quit date: 02/13/2016  . Smokeless tobacco: Never Used     Comment: quit recently  . Alcohol Use: No     Comment: ocassionally    Family History  Problem Relation Age of Onset  . Anesthesia problems Neg Hx   . Depression Mother   . Hypertension Mother   . Hypertension Father   . Diabetes Maternal Aunt   . Cancer Paternal Aunt   . Diabetes Maternal Grandmother      Review of Systems Constitutional: negative for weight loss Gastrointestinal: negative for vomiting, previously Genitourinary:negative for genital lesions and vaginal discharge and dysuria Musculoskeletal:negative for back pain Behavioral/Psych: negative for abusive relationship, depression, illegal drug usage and tobacco use    Objective:    BP 120/71 mmHg  Pulse 88  Wt 252 lb (114.306 kg)  LMP 02/13/2016 (Exact Date) General Appearance:    Alert,  cooperative, no distress, appears stated age  Head:    Normocephalic, without obvious abnormality, atraumatic  Eyes:    PERRL, conjunctiva/corneas clear, EOM's intact, fundi    benign, both eyes  Ears:    Normal TM's and external ear canals, both ears  Nose:   Nares normal, septum midline, mucosa normal, no drainage    or sinus tenderness  Throat:   Lips, mucosa, and tongue normal; teeth and gums normal  Neck:   Supple, symmetrical, trachea midline, no adenopathy;    thyroid:  no enlargement/tenderness/nodules; no carotid   bruit or JVD  Back:     Symmetric, no curvature, ROM normal, no CVA tenderness  Lungs:     Clear to auscultation bilaterally, respirations unlabored  Chest Wall:    No tenderness or deformity   Heart:    Regular rate and rhythm, S1 and S2 normal, no murmur, rub   or gallop  Breast Exam:    No tenderness, masses, or nipple abnormality  Abdomen:     Soft, non-tender, bowel sounds active all four quadrants,    no masses, no organomegaly  Genitalia:    Normal female without lesion, discharge or tenderness  Extremities:   Extremities normal, atraumatic, no cyanosis or edema  Pulses:   2+ and symmetric all extremities  Skin:   Skin color, texture, turgor normal, no rashes or  lesions  Lymph nodes:   Cervical, supraclavicular, and axillary nodes normal  Neurologic:   CNII-XII intact, normal strength, sensation and reflexes    throughout         Cervix:    Long, thick, closed and posterior    Lab Review Urine pregnancy test Labs reviewed yes Radiologic studies reviewed yes Assessment:    Pregnancy at [redacted]w[redacted]d weeks   Late to prenatal care  Plan:     Prenatal vitamins.  Counseling provided regarding continued use of seat belts, cessation of alcohol consumption, smoking or use of illicit drugs; infection precautions i.e., influenza/TDAP immunizations, toxoplasmosis,CMV, parvovirus, listeria and varicella; workplace safety, exercise during pregnancy; routine dental care,  safe medications, sexual activity, hot tubs, saunas, pools, travel, caffeine use, fish and methlymercury, potential toxins, hair treatments, varicose veins Weight gain recommendations per IOM guidelines reviewed: underweight/BMI< 18.5--> gain 28 - 40 lbs; normal weight/BMI 18.5 - 24.9--> gain 25 - 35 lbs; overweight/BMI 25 - 29.9--> gain 15 - 25 lbs; obese/BMI >30->gain  11 - 20 lbs Problem list reviewed and updated. FIRST/CF mutation testing/NIPT/QUAD SCREEN/fragile X/Ashkenazi Jewish population testing/Spinal muscular atrophy discussed: ordered. Role of ultrasound in pregnancy discussed; fetal survey: ordered. Amniocentesis discussed: not indicated. VBAC calculator score: VBAC consent form provided Meds ordered this encounter  Medications  . Pren-Fe-Meth-FA-Omeg w/o A (PRIMACARE) 30-1-470 MG CAPS    Sig: Take 1 tablet by mouth daily.    Dispense:  30 capsule    Refill:  12   Orders Placed This Encounter  Procedures  . Culture, OB Urine  . [place] MFM OB COMP + 14 WK    Standing Status: Future     Number of Occurrences:      Standing Expiration Date: 08/07/2017    Order Specific Question:  Reason for Exam (SYMPTOM  OR DIAGNOSIS REQUIRED)    Answer:  fetal anatomy scan    Order Specific Question:  Preferred imaging location?    Answer:  [facility]-Ultrasound  . TSH  . HIV antibody  . Hemoglobinopathy evaluation  . Varicella zoster antibody, IgG  . Prenatal Profile I  . MaterniT21 PLUS Core+SCA    Order Specific Question:  Is the patient insulin dependent?    Answer:  No    Order Specific Question:  Please enter gestational age. This should be expressed as weeks AND days, i.e. 16w 6d. Enter weeks here. Enter days in next question.    Answer:  74    Order Specific Question:  Please enter gestational age. This should be expressed as weeks AND days, i.e. 16w 6d. Enter days here. Enter weeks in previous question.    Answer:  3    Order Specific Question:  How was gestational age calculated?     Answer:  Ultrasound    Order Specific Question:  Please give the date of LMP OR Ultrasound OR Estimated date of delivery.    Answer:  11/19/2016    Order Specific Question:  Number of Fetuses (Type of Pregnancy):    Answer:  1    Order Specific Question:  Indications for performing the test? (please choose all that apply):    Answer:  Routine screening    Order Specific Question:  Other Indications? (Y=Yes, N=No)    Answer:  N    Order Specific Question:  If this is a repeat specimen, please indicate the reason:    Answer:  Not indicated    Order Specific Question:  Please specify the patient's race: (C=White/Caucasion, B=Black, I=Native American, A=Asian,  H=Hispanic, O=Other, U=Unknown)    Answer:  B    Order Specific Question:  Donor Egg - indicate if the egg was obtained from in vitro fertilization.    Answer:  N    Order Specific Question:  Age of Egg Donor.    Answer:  36    Order Specific Question:  Prior Down Syndrome/ONTD screening during current pregnancy.    Answer:  N    Order Specific Question:  Prior First Trimester Testing    Answer:  N    Order Specific Question:  Prior Second Trimester Testing    Answer:  N    Order Specific Question:  Family History of Neural Tube Defects    Answer:  N    Order Specific Question:  Prior Pregnancy with Down Syndrome    Answer:  N    Order Specific Question:  Please give the patient's weight (in pounds)    Answer:  252  . POCT urinalysis dipstick    Follow up in 4 weeks. 50% of 30 min visit spent on counseling and coordination of care.

## 2016-06-08 ENCOUNTER — Other Ambulatory Visit: Payer: Self-pay | Admitting: Certified Nurse Midwife

## 2016-06-08 DIAGNOSIS — B373 Candidiasis of vulva and vagina: Secondary | ICD-10-CM

## 2016-06-08 DIAGNOSIS — Z3480 Encounter for supervision of other normal pregnancy, unspecified trimester: Secondary | ICD-10-CM | POA: Insufficient documentation

## 2016-06-08 DIAGNOSIS — B3731 Acute candidiasis of vulva and vagina: Secondary | ICD-10-CM

## 2016-06-08 LAB — PAP IG W/ RFLX HPV ASCU: PAP SMEAR COMMENT: 0

## 2016-06-08 MED ORDER — TERCONAZOLE 0.8 % VA CREA: 1.0000 | TOPICAL_CREAM | Freq: Every day | VAGINAL | Status: DC

## 2016-06-08 MED ORDER — FLUCONAZOLE 100 MG PO TABS: 100.0000 mg | ORAL_TABLET | Freq: Once | ORAL | Status: DC

## 2016-06-09 ENCOUNTER — Encounter: Payer: Self-pay | Admitting: *Deleted

## 2016-06-09 LAB — HIV ANTIBODY (ROUTINE TESTING W REFLEX): HIV Screen 4th Generation wRfx: NONREACTIVE

## 2016-06-09 LAB — PRENATAL PROFILE I(LABCORP)
Antibody Screen: NEGATIVE
BASOS ABS: 0 10*3/uL (ref 0.0–0.2)
Basos: 0 %
EOS (ABSOLUTE): 0.1 10*3/uL (ref 0.0–0.4)
EOS: 1 %
Hematocrit: 36.6 % (ref 34.0–46.6)
Hemoglobin: 12.1 g/dL (ref 11.1–15.9)
Hepatitis B Surface Ag: NEGATIVE
IMMATURE GRANULOCYTES: 0 %
Immature Grans (Abs): 0 10*3/uL (ref 0.0–0.1)
Lymphocytes Absolute: 2.5 10*3/uL (ref 0.7–3.1)
Lymphs: 24 %
MCH: 27.8 pg (ref 26.6–33.0)
MCHC: 33.1 g/dL (ref 31.5–35.7)
MCV: 84 fL (ref 79–97)
MONOCYTES: 7 %
Monocytes Absolute: 0.7 10*3/uL (ref 0.1–0.9)
NEUTROS ABS: 7 10*3/uL (ref 1.4–7.0)
NEUTROS PCT: 68 %
Platelets: 294 10*3/uL (ref 150–379)
RBC: 4.35 x10E6/uL (ref 3.77–5.28)
RDW: 13.8 % (ref 12.3–15.4)
RH TYPE: POSITIVE
RPR Ser Ql: NONREACTIVE
Rubella Antibodies, IGG: 5.31 index (ref >0.99)
WBC: 10.3 10*3/uL (ref 3.4–10.8)

## 2016-06-09 LAB — HEMOGLOBINOPATHY EVALUATION
HEMOGLOBIN A2 QUANTITATION: 2.1 % (ref 0.7–3.1)
HGB C: 0 %
HGB S: 0 %
Hemoglobin F Quantitation: 0 % (ref 0.0–2.0)
Hgb A: 97.9 % (ref 94.0–98.0)

## 2016-06-09 LAB — TSH: TSH: 1.2 u[IU]/mL (ref 0.450–4.500)

## 2016-06-09 LAB — VARICELLA ZOSTER ANTIBODY, IGG: VARICELLA: 2192 {index} (ref >165)

## 2016-06-10 LAB — NUSWAB VG+, CANDIDA 6SP
Atopobium vaginae: HIGH Score — AB
BVAB 2: HIGH {score} — AB
CANDIDA ALBICANS, NAA: POSITIVE — AB
CANDIDA GLABRATA, NAA: NEGATIVE
CANDIDA LUSITANIAE, NAA: NEGATIVE
CANDIDA TROPICALIS, NAA: NEGATIVE
Candida krusei, NAA: NEGATIVE
Candida parapsilosis, NAA: NEGATIVE
Chlamydia trachomatis, NAA: NEGATIVE
Megasphaera 1: HIGH Score — AB
NEISSERIA GONORRHOEAE, NAA: NEGATIVE
Trich vag by NAA: NEGATIVE

## 2016-06-10 LAB — URINE CULTURE, OB REFLEX

## 2016-06-10 LAB — CULTURE, OB URINE

## 2016-06-13 ENCOUNTER — Other Ambulatory Visit: Payer: Self-pay | Admitting: Certified Nurse Midwife

## 2016-06-13 DIAGNOSIS — N760 Acute vaginitis: Principal | ICD-10-CM

## 2016-06-13 DIAGNOSIS — B9689 Other specified bacterial agents as the cause of diseases classified elsewhere: Secondary | ICD-10-CM

## 2016-06-13 MED ORDER — METRONIDAZOLE 500 MG PO TABS: 500.0000 mg | ORAL_TABLET | Freq: Two times a day (BID) | ORAL | Status: DC

## 2016-06-14 ENCOUNTER — Other Ambulatory Visit: Payer: Self-pay | Admitting: Certified Nurse Midwife

## 2016-06-14 LAB — MATERNIT21 PLUS CORE+SCA
CHROMOSOME 13: NEGATIVE
Chromosome 18: NEGATIVE
Chromosome 21: NEGATIVE
PDF: 0
Y Chromosome: NOT DETECTED

## 2016-06-16 ENCOUNTER — Encounter: Payer: Self-pay | Admitting: *Deleted

## 2016-06-20 ENCOUNTER — Encounter (HOSPITAL_COMMUNITY): Payer: Self-pay | Admitting: Certified Nurse Midwife

## 2016-06-27 ENCOUNTER — Ambulatory Visit (HOSPITAL_COMMUNITY)
Admission: RE | Admit: 2016-06-27 | Discharge: 2016-06-27 | Disposition: A | Discharge status: Home / Self Care | Payer: 59 | Source: Ambulatory Visit | Attending: Certified Nurse Midwife | Admitting: Certified Nurse Midwife

## 2016-06-27 ENCOUNTER — Other Ambulatory Visit: Payer: Self-pay | Admitting: Certified Nurse Midwife

## 2016-06-27 DIAGNOSIS — Z3A19 19 weeks gestation of pregnancy: Secondary | ICD-10-CM | POA: Insufficient documentation

## 2016-06-27 DIAGNOSIS — O99212 Obesity complicating pregnancy, second trimester: Secondary | ICD-10-CM | POA: Insufficient documentation

## 2016-06-27 DIAGNOSIS — E669 Obesity, unspecified: Secondary | ICD-10-CM | POA: Diagnosis present

## 2016-06-27 DIAGNOSIS — Z36 Encounter for antenatal screening of mother: Secondary | ICD-10-CM | POA: Insufficient documentation

## 2016-06-27 DIAGNOSIS — Z3482 Encounter for supervision of other normal pregnancy, second trimester: Secondary | ICD-10-CM

## 2016-06-27 DIAGNOSIS — Z1389 Encounter for screening for other disorder: Secondary | ICD-10-CM

## 2016-06-27 DIAGNOSIS — Z363 Encounter for antenatal screening for malformations: Secondary | ICD-10-CM

## 2016-06-30 ENCOUNTER — Other Ambulatory Visit: Payer: Self-pay | Admitting: Certified Nurse Midwife

## 2016-07-05 ENCOUNTER — Ambulatory Visit (INDEPENDENT_AMBULATORY_CARE_PROVIDER_SITE_OTHER): Payer: 59 | Admitting: Certified Nurse Midwife

## 2016-07-05 VITALS — BP 100/68 | HR 80 | Temp 98.4°F | Wt 253.0 lb

## 2016-07-05 DIAGNOSIS — Z3482 Encounter for supervision of other normal pregnancy, second trimester: Secondary | ICD-10-CM

## 2016-07-05 LAB — POCT URINALYSIS DIPSTICK
BILIRUBIN UA: NEGATIVE
Blood, UA: NEGATIVE
GLUCOSE UA: NEGATIVE
KETONES UA: NEGATIVE
Leukocytes, UA: NEGATIVE
Nitrite, UA: NEGATIVE
Protein, UA: NEGATIVE
SPEC GRAV UA: 1.01
Urobilinogen, UA: NEGATIVE
pH, UA: 7

## 2016-07-05 NOTE — Assessment & Plan Note (Signed)
  Clinic  Prenatal Labs  Dating  Blood type: O/Positive/-- (06/13 1506)   Genetic Screen 1 Screen:    AFP:     Quad:     NIPS: Antibody:Negative (06/13 1506)  Anatomic US  Rubella: 5.31 (06/13 1506)  GTT Early:               Third trimester:  RPR: Non Reactive (06/13 1506)   Flu vaccine  HBsAg: Negative (06/13 1506)   TDaP vaccine                                               Rhogam: HIV: Non Reactive (06/13 1506)   Baby Food                                               GBS: (For PCN allergy, check sensitivities)  Contraception  Pap:  Circumcision    Pediatrician    Support Person

## 2016-07-05 NOTE — Progress Notes (Signed)
Subjective:    Bayard Beaverortia Friede is a 25 y.o. female being seen today for her obstetrical visit. She is at 2961w3d gestation. Patient reports: no complaints . Fetal movement: normal.  Problem List Items Addressed This Visit      Other   Encounter for supervision of other normal pregnancy in second trimester - Primary   Relevant Orders   POCT urinalysis dipstick (Completed)     Patient Active Problem List   Diagnosis Date Noted  . Encounter for supervision of other normal pregnancy in second trimester 06/08/2016  . H/O cystitis   . H/O chlamydia infection   . H/O gonorrhea   . Trichomonas   . History of being obese    Objective:    BP 100/68 mmHg  Pulse 80  Temp(Src) 98.4 F (36.9 C)  Wt 253 lb (114.76 kg)  LMP 02/13/2016 (Exact Date) FHT: 145 BPM  Uterine Size: size equals dates     Assessment:    Pregnancy @ 8561w3d    doing well  Plan:    OBGCT: discussed. Signs and symptoms of preterm labor: discussed.  Labs, problem list reviewed and updated 2 hr GTT planned Follow up in 4 weeks.

## 2016-08-02 ENCOUNTER — Ambulatory Visit (INDEPENDENT_AMBULATORY_CARE_PROVIDER_SITE_OTHER): Payer: 59 | Admitting: Certified Nurse Midwife

## 2016-08-02 VITALS — BP 105/71 | HR 84 | Wt 256.0 lb

## 2016-08-02 DIAGNOSIS — Z3482 Encounter for supervision of other normal pregnancy, second trimester: Secondary | ICD-10-CM

## 2016-08-02 LAB — POCT URINALYSIS DIPSTICK
BILIRUBIN UA: NEGATIVE
Glucose, UA: NEGATIVE
Ketones, UA: NEGATIVE
LEUKOCYTES UA: NEGATIVE
NITRITE UA: NEGATIVE
PH UA: 5.5
PROTEIN UA: NEGATIVE
RBC UA: NEGATIVE
Spec Grav, UA: 1.02
UROBILINOGEN UA: NEGATIVE

## 2016-08-02 NOTE — Progress Notes (Signed)
Subjective:    Jodi Mcdonald is a 25 y.o. female being seen today for her obstetrical visit. She is at 9976w3d gestation. Patient reports: no complaints . Fetal movement: normal.  Problem List Items Addressed This Visit    None    Visit Diagnoses   None.    Patient Active Problem List   Diagnosis Date Noted  . Encounter for supervision of other normal pregnancy in second trimester 06/08/2016  . H/O cystitis   . H/O chlamydia infection   . H/O gonorrhea   . Trichomonas   . History of being obese    Objective:    BP 105/71   Pulse 84   Wt 256 lb (116.1 kg)   LMP 02/13/2016 (Exact Date)   BMI 41.32 kg/m  FHT: 152 BPM  Uterine Size: size equals dates     Assessment:    Pregnancy @ 7676w3d    Plan:    Plans to breastfeed.    OBGCT: discussed and ordered for next visit. Signs and symptoms of preterm labor: discussed.  Labs, problem list reviewed and updated 2 hr GTT planned with next ROB visit.  Follow up in 4 weeks.

## 2016-08-10 ENCOUNTER — Ambulatory Visit (INDEPENDENT_AMBULATORY_CARE_PROVIDER_SITE_OTHER): Payer: 59 | Admitting: Certified Nurse Midwife

## 2016-08-10 VITALS — BP 113/77 | HR 99 | Temp 99.6°F | Wt 254.8 lb

## 2016-08-10 DIAGNOSIS — Z3492 Encounter for supervision of normal pregnancy, unspecified, second trimester: Secondary | ICD-10-CM | POA: Diagnosis not present

## 2016-08-10 DIAGNOSIS — Z331 Pregnant state, incidental: Secondary | ICD-10-CM | POA: Diagnosis not present

## 2016-08-10 DIAGNOSIS — N739 Female pelvic inflammatory disease, unspecified: Secondary | ICD-10-CM | POA: Diagnosis not present

## 2016-08-10 LAB — POCT URINALYSIS DIPSTICK
Bilirubin, UA: NEGATIVE
Blood, UA: NEGATIVE
GLUCOSE UA: NEGATIVE
Ketones, UA: 2
Leukocytes, UA: NEGATIVE
NITRITE UA: NEGATIVE
Protein, UA: NEGATIVE
Spec Grav, UA: 1.01
UROBILINOGEN UA: NEGATIVE
pH, UA: 7

## 2016-08-10 MED ORDER — AMOXICILLIN-POT CLAVULANATE 875-125 MG PO TABS: 1.0000 | ORAL_TABLET | Freq: Two times a day (BID) | ORAL | 0 refills | Status: DC

## 2016-08-10 NOTE — Addendum Note (Signed)
Addended by: Marya LandryFOSTER, Azariah Bonura D on: 08/10/2016 04:39 PM   Modules accepted: Orders

## 2016-08-10 NOTE — Progress Notes (Signed)
Patient reports she has had 2 cyst- vaginal/labial during this pregnancy. She had the first one on the R and it drained and went away. This second one is L and lower and causing discomfort. It did start draining Monday- and has decreased in size.

## 2016-08-10 NOTE — Progress Notes (Signed)
Patient ID: Jodi Beaverortia Wolak, female   DOB: 03-17-1991, 25 y.o.   MRN: [id number]  Chief Complaint  Patient presents with  . Routine Prenatal Visit    HPI Jodi Mcdonald is a 25 y.o. female.  Here for labial abscess.  Had a hx of one prior to this at the start of her pregnancy on the left side which resolved on its own around 10 weeks.  States that she thinks it drained a little yesterday.  Does not hurt as much today.  +FM.   Denies any contractions, cramping, vaginal bleeding or leaking.    HPI  Past Medical History:  Diagnosis Date  . H/O chlamydia infection 2009  . H/O cystitis   . H/O gonorrhea 2012   . History of being obese   . Infection    UTI  . Trichomonas 2009    Past Surgical History:  Procedure Laterality Date  . NO PAST SURGERIES    . WISDOM TOOTH EXTRACTION      Family History  Problem Relation Age of Onset  . Anesthesia problems Neg Hx   . Depression Mother   . Hypertension Mother   . Hypertension Father   . Diabetes Maternal Aunt   . Cancer Paternal Aunt   . Diabetes Maternal Grandmother     Social History Social History  Substance Use Topics  . Smoking status: Former Smoker    Packs/day: 0.25    Types: Cigarettes    Quit date: 02/13/2016  . Smokeless tobacco: Never Used     Comment: quit recently  . Alcohol use No     Comment: ocassionally    No Known Allergies  Current Outpatient Prescriptions  Medication Sig Dispense Refill  . Pren-Fe-Meth-FA-Omeg w/o A (PRIMACARE) 30-1-470 MG CAPS Take 1 tablet by mouth daily. 30 capsule 12  . amoxicillin-clavulanate (AUGMENTIN) 875-125 MG tablet Take 1 tablet by mouth 2 (two) times daily. 14 tablet 0   No current facility-administered medications for this visit.     Review of Systems Review of Systems Constitutional: negative for fatigue and weight loss Respiratory: negative for cough and wheezing Cardiovascular: negative for chest pain, fatigue and palpitations Gastrointestinal: negative for  abdominal pain and change in bowel habits Genitourinary:negative, + right labial abscess Integument/breast: negative for nipple discharge Musculoskeletal:negative for myalgias Neurological: negative for gait problems and tremors Behavioral/Psych: negative for abusive relationship, depression Endocrine: negative for temperature intolerance     Blood pressure 113/77, pulse 99, temperature 99.6 F (37.6 C), weight 254 lb 12.8 oz (115.6 kg), last menstrual period 02/13/2016, unknown if currently breastfeeding.  Physical Exam Physical Exam General:   alert  Skin:   no rash or abnormalities  Lungs:   clear to auscultation bilaterally  Heart:   regular rate and rhythm, S1, S2 normal, no murmur, click, rub or gallop  Breasts:   deferred  Abdomen:  normal findings: no organomegaly, soft, non-tender and no hernia  gravid.  FH: 24 cm.  FHR: 140 by doppler  Pelvis:  External genitalia: normal general appearance, right labial abscess about 3 cm in size, raised with center indentation.   Urinary system: urethral meatus normal and bladder without fullness, nontender Vaginal: normal without tenderness, induration or masses    Physical Exam Physical Exam General:   alert   No signs of fever.   Regional lymph nodes are not tender or enlarged.    Procedure:  I&D of right labial abscess Localized area of erythema, swelling, warmth and tenderness present about 3 cm round with  raised area of closed skin.   A time-out was performed confirming the procedure and allergy status.  The skin was preppe with betadine X 3,  The skin was infiltrated with 1% lidocaine with epinephrine injected into abscess.  Incision made center of raised skin,  Culture obtained, Pus was expressed, antiobiotic cream applied.  Dressing applied.  Post op care and follow up given to patient.   25% of 30 min visit spent on counseling and coordination of care.    50% of 30 min visit spent on counseling and coordination of care.    Data Reviewed Previous medical hx, meds, labs  Assessment     Right Labial abscess I&D IUP @[redacted]w[redacted]d     Plan    Orders Placed This Encounter  Procedures  . POCT urinalysis dipstick   Meds ordered this encounter  Medications  . amoxicillin-clavulanate (AUGMENTIN) 875-125 MG tablet    Sig: Take 1 tablet by mouth 2 (two) times daily.    Dispense:  14 tablet    Refill:  0     Possible management options include:dermatology referral  Follow up as needed. ROB as scheduled for 2 hour OGTT

## 2016-08-14 LAB — WOUND CULTURE

## 2016-08-16 ENCOUNTER — Other Ambulatory Visit: Payer: Self-pay | Admitting: Certified Nurse Midwife

## 2016-08-16 DIAGNOSIS — Z22322 Carrier or suspected carrier of Methicillin resistant Staphylococcus aureus: Secondary | ICD-10-CM

## 2016-08-16 MED ORDER — CLINDAMYCIN HCL 300 MG PO CAPS: 300.0000 mg | ORAL_CAPSULE | Freq: Three times a day (TID) | ORAL | 0 refills | Status: DC

## 2016-08-17 ENCOUNTER — Telehealth: Payer: Self-pay | Admitting: *Deleted

## 2016-08-17 NOTE — Telephone Encounter (Signed)
Patient aware of MRSA culture result and change in antibiotic Tx.Explained to patient call if any changes in the appearance of this cyst.

## 2016-08-30 ENCOUNTER — Encounter: Payer: Self-pay | Admitting: *Deleted

## 2016-08-30 ENCOUNTER — Other Ambulatory Visit: Payer: 59

## 2016-08-30 ENCOUNTER — Ambulatory Visit (INDEPENDENT_AMBULATORY_CARE_PROVIDER_SITE_OTHER): Payer: 59 | Admitting: Certified Nurse Midwife

## 2016-08-30 VITALS — BP 116/79 | HR 89 | Temp 98.4°F | Wt 258.0 lb

## 2016-08-30 DIAGNOSIS — Z3482 Encounter for supervision of other normal pregnancy, second trimester: Secondary | ICD-10-CM

## 2016-08-30 DIAGNOSIS — Z3493 Encounter for supervision of normal pregnancy, unspecified, third trimester: Secondary | ICD-10-CM

## 2016-08-30 LAB — POCT URINALYSIS DIPSTICK
Bilirubin, UA: NEGATIVE
GLUCOSE UA: NEGATIVE
Ketones, UA: NEGATIVE
NITRITE UA: NEGATIVE
PH UA: 7
Protein, UA: NEGATIVE
RBC UA: NEGATIVE
SPEC GRAV UA: 1.01
UROBILINOGEN UA: NEGATIVE

## 2016-08-30 NOTE — Patient Instructions (Addendum)
Breast Pumping Tips °If you are breastfeeding, there may be times when you cannot feed your baby directly. Returning to work or going on a trip are common examples. Pumping allows you to store breast milk and feed it to your baby later.  °You may not get much milk when you first start to pump. Your breasts should start to make more after a few days. If you pump at the times you usually feed your baby, you may be able to keep making enough milk to feed your baby without also using formula. The more often you pump, the more milk you will produce.  °WHEN SHOULD I PUMP?  °· You can begin to pump soon after delivery. However, some experts recommend waiting about 4 weeks before giving your infant a bottle to make sure breastfeeding is going well.  °· If you plan to return to work, begin pumping a few weeks before. This will help you develop techniques that work best for you. It also lets you build up a supply of breast milk.   °· When you are with your infant, feed on demand and pump after each feeding.   °· When you are away from your infant for several hours, pump for about 15 minutes every 2-3 hours. Pump both breasts at the same time if you can.   °· If your infant has a formula feeding, make sure to pump around the same time.     °· If you drink any alcohol, wait 2 hours before pumping.   °HOW DO I PREPARE TO PUMP? °Your let-down reflex is the natural reaction to stimulation that makes your breast milk flow. It is easier to stimulate this reflex when you are relaxed. Find relaxation techniques that work for you. If you have difficulty with your let-down reflex, try these methods:  °· Smell one of your infant's blankets or an item of clothing.   °· Look at a picture or video of your infant.   °· Sit in a quiet, private space.   °· Massage the breast you plan to pump.   °· Place soothing warmth on the breast.   °· Play relaxing music.   °WHAT ARE SOME GENERAL BREAST PUMPING TIPS? °· Wash your hands before you pump. You  do not need to wash your nipples or breasts. °· There are three ways to pump. °¨ You can use your hand to massage and compress your breast. °¨ You can use a handheld manual pump. °¨ You can use an electric pump.   °· Make sure the suction cup (flange) on the breast pump is the right size. Place the flange directly over the nipple. If it is the wrong size or placed the wrong way, it may be painful and cause nipple damage.   °· If pumping is uncomfortable, apply a small amount of purified or modified lanolin to your nipple and areola. °· If you are using an electric pump, adjust the speed and suction power to be more comfortable. °· If pumping is painful or if you are not getting very much milk, you may need a different type of pump. A lactation consultant can help you determine what type of pump to use.   °· Keep a full water bottle near you at all times. Drinking lots of fluid helps you make more milk.  °· You can store your milk to use later. Pumped breast milk can be stored in a sealable, sterile container or plastic bag. Label all stored breast milk with the date you pumped it. °¨ Milk can stay out at room temperature for up to 8 hours. °¨   You can store your milk in the refrigerator for up to 8 days.  You can store your milk in the freezer for 3 months. Thaw frozen milk using warm water. Do not put it in the microwave.  Do not smoke. Smoking can lower your milk supply and harm your infant. If you need help quitting, ask your health care provider to recommend a program.  WHEN SHOULD I CALL MY HEALTH CARE PROVIDER OR A LACTATION CONSULTANT?  You are having trouble pumping.  You are concerned that you are not making enough milk.  You have nipple pain, soreness, or redness.  You want to use birth control. Birth control pills may lower your milk supply. Talk to your health care provider about your options.   This information is not intended to replace advice given to you by your health care provider.  Make sure you discuss any questions you have with your health care provider.   Document Released: 06/01/2010 Document Revised: 12/17/2013 Document Reviewed: 10/04/2013 Elsevier Interactive Patient Education 2016 [employer].  Before Baby Comes Home Before your baby arrives it is important to:  Have all of the supplies that you will need to care for your baby.  Know where to go if there is an emergency.  Discuss the baby's arrival with other family members. WHAT SUPPLIES WILL I NEED? It is recommended that you have the following supplies: Large Items  Crib.  Crib mattress.  Rear-facing infant car seat. If possible, have a trained professional check to make sure that it is installed correctly. Feeding  6-8 bottles that are 4-5 oz in size.  6-8 nipples.  Bottle brush.  Sterilizer, or a large pan or kettle with a lid.  A way to boil and cool water.  If you will be breastfeeding:  Breast pump.  Nipple cream.  Nursing bra.  Breast pads.  Breast shields.  If you will be formula feeding:  Formula.  Measuring cups.  Measuring spoons. Bathing  Mild baby soap and baby shampoo.  Petroleum jelly.  Soft cloth towel and washcloth.  Hooded towel.  Cotton balls.  Bath basin. Other Supplies  Rectal thermometer.  Bulb syringe.  Baby wipes or washcloths for diaper changes.  Diaper bag.  Changing pad.  Clothing, including one-piece outfits and pajamas.  Baby nail clippers.  Receiving blankets.  Mattress pad and sheets for the crib.  Night-light for the baby's room.  Baby monitor.  2 or 3 pacifiers.  Either 24-36 cloth diapers and waterproof diaper covers or a box of disposable diapers. You may need to use as many as 10-12 diapers per day. HOW DO I PREPARE FOR AN EMERGENCY? Prepare for an emergency by:  Knowing how to get to the nearest hospital.  Listing the phone numbers of your baby's health care providers near your home phone and in  your cell phone. HOW DO I PREPARE MY FAMILY?  Decide how to handle visitors.  If you have other children:  Talk with them about the baby coming home. Ask them how they feel about it.  Read a book together about being a new big brother or sister.  Find ways to let them help you prepare for the new baby.  Have someone ready to care for them while you are in the hospital.   This information is not intended to replace advice given to you by your health care provider. Make sure you discuss any questions you have with your health care provider.   Document Released: 11/24/2008  Document Revised: 04/28/2015 Document Reviewed: 11/19/2014 Elsevier Interactive Patient Education [employer]2016 [employer]  Third Trimester of Pregnancy The third trimester is from week 29 through week 42, months 7 through 9. This trimester is when your unborn baby (fetus) is growing very fast. At the end of the ninth month, the unborn baby is about 20 inches in length. It weighs about 6-10 pounds.  HOME CARE   Avoid all smoking, herbs, and alcohol. Avoid drugs not approved by your doctor.  Do not use any tobacco products, including cigarettes, chewing tobacco, and electronic cigarettes. If you need help quitting, ask your doctor. You may get counseling or other support to help you quit.  Only take medicine as told by your doctor. Some medicines are safe and some are not during pregnancy.  Exercise only as told by your doctor. Stop exercising if you start having cramps.  Eat regular, healthy meals.  Wear a good support bra if your breasts are tender.  Do not use hot tubs, steam rooms, or saunas.  Wear your seat belt when driving.  Avoid raw meat, uncooked cheese, and liter boxes and soil used by cats.  Take your prenatal vitamins.  Take 1500-2000 milligrams of calcium daily starting at the 20th week of pregnancy until you deliver your baby.  Try taking medicine that helps you poop (stool softener) as needed,  and if your doctor approves. Eat more fiber by eating fresh fruit, vegetables, and whole grains. Drink enough fluids to keep your pee (urine) clear or pale yellow.  Take warm water baths (sitz baths) to soothe pain or discomfort caused by hemorrhoids. Use hemorrhoid cream if your doctor approves.  If you have puffy, bulging veins (varicose veins), wear support hose. Raise (elevate) your feet for 15 minutes, 3-4 times a day. Limit salt in your diet.  Avoid heavy lifting, wear low heels, and sit up straight.  Rest with your legs raised if you have leg cramps or low back pain.  Visit your dentist if you have not gone during your pregnancy. Use a soft toothbrush to brush your teeth. Be gentle when you floss.  You can have sex (intercourse) unless your doctor tells you not to.  Do not travel far distances unless you must. Only do so with your doctor's approval.  Take prenatal classes.  Practice driving to the hospital.  Pack your hospital bag.  Prepare the baby's room.  Go to your doctor visits. GET HELP IF:  You are not sure if you are in labor or if your water has broken.  You are dizzy.  You have mild cramps or pressure in your lower belly (abdominal).  You have a nagging pain in your belly area.  You continue to feel sick to your stomach (nauseous), throw up (vomit), or have watery poop (diarrhea).  You have bad smelling fluid coming from your vagina.  You have pain with peeing (urination). GET HELP RIGHT AWAY IF:   You have a fever.  You are leaking fluid from your vagina.  You are spotting or bleeding from your vagina.  You have severe belly cramping or pain.  You lose or gain weight rapidly.  You have trouble catching your breath and have chest pain.  You notice sudden or extreme puffiness (swelling) of your face, hands, ankles, feet, or legs.  You have not felt the baby move in over an hour.  You have severe headaches that do not go away with  medicine.  You have vision changes.  This information is not intended to replace advice given to you by your health care provider. Make sure you discuss any questions you have with your health care provider.   Document Released: 03/08/2010 Document Revised: 01/02/2015 Document Reviewed: 02/12/2013 Elsevier Interactive Patient Education [employer] Elsevier Inc.

## 2016-08-30 NOTE — Progress Notes (Signed)
Patient states she is doing well today without concerns

## 2016-08-30 NOTE — Progress Notes (Signed)
Subjective:    Jodi Mcdonald is a 25 y.o. female being seen today for her obstetrical visit. She is at 1733w3d gestation. Patient reports no complaints. Fetal movement: normal.  Problem List Items Addressed This Visit    None    Visit Diagnoses    Supervision of normal pregnancy, third trimester    -  Primary   Relevant Orders   Glucose Tolerance, 2 Hours w/1 Hour   CBC   HIV antibody   RPR   POCT urinalysis dipstick (Completed)     Patient Active Problem List   Diagnosis Date Noted  . Encounter for supervision of other normal pregnancy in second trimester 06/08/2016  . H/O cystitis   . H/O chlamydia infection   . H/O gonorrhea   . Trichomonas   . History of being obese    Objective:    BP 116/79   Pulse 89   Temp 98.4 F (36.9 C)   Wt 258 lb (117 kg)   LMP 02/13/2016 (Exact Date)   BMI 41.64 kg/m  FHT:  155 BPM  Uterine Size: 28 cm and size equals dates  Presentation: cephalic     Assessment:    Pregnancy @ 9433w3d weeks   2 Hour OGTT today  H/O MRSA in pregnancy  Plan:   2 hour OGTT today   labs reviewed, problem list updated Consent signed.  Aeroflow pump ordered GBS planning TDAP offered  Rhogam given for RH negative Pediatrician: discussed. Infant feeding: plans to breastfeed. Maternity leave: discussed. Cigarette smoking: never smoked. Orders Placed This Encounter  Procedures  . Glucose Tolerance, 2 Hours w/1 Hour  . CBC  . HIV antibody  . RPR  . POCT urinalysis dipstick   No orders of the defined types were placed in this encounter.  Follow up in 2 Weeks.

## 2016-08-31 ENCOUNTER — Other Ambulatory Visit: Payer: Self-pay | Admitting: Certified Nurse Midwife

## 2016-08-31 DIAGNOSIS — Z3482 Encounter for supervision of other normal pregnancy, second trimester: Secondary | ICD-10-CM

## 2016-08-31 LAB — GLUCOSE TOLERANCE, 2 HOURS W/ 1HR
GLUCOSE, 1 HOUR: 103 mg/dL (ref 65–179)
GLUCOSE, FASTING: 88 mg/dL (ref 65–91)
Glucose, 2 hour: 67 mg/dL (ref 65–152)

## 2016-08-31 LAB — CBC
HEMOGLOBIN: 11.9 g/dL (ref 11.1–15.9)
Hematocrit: 36.3 % (ref 34.0–46.6)
MCH: 27.4 pg (ref 26.6–33.0)
MCHC: 32.8 g/dL (ref 31.5–35.7)
MCV: 84 fL (ref 79–97)
PLATELETS: 321 10*3/uL (ref 150–379)
RBC: 4.34 x10E6/uL (ref 3.77–5.28)
RDW: 15 % (ref 12.3–15.4)
WBC: 11.2 10*3/uL — ABNORMAL HIGH (ref 3.4–10.8)

## 2016-08-31 LAB — HIV ANTIBODY (ROUTINE TESTING W REFLEX): HIV SCREEN 4TH GENERATION: NONREACTIVE

## 2016-08-31 LAB — RPR: RPR Ser Ql: NONREACTIVE

## 2016-09-19 ENCOUNTER — Encounter: Payer: Self-pay | Admitting: Obstetrics and Gynecology

## 2016-09-19 ENCOUNTER — Ambulatory Visit (INDEPENDENT_AMBULATORY_CARE_PROVIDER_SITE_OTHER): Payer: 59 | Admitting: Obstetrics and Gynecology

## 2016-09-19 VITALS — BP 116/76 | HR 83 | Temp 99.0°F | Wt 262.0 lb

## 2016-09-19 DIAGNOSIS — O99210 Obesity complicating pregnancy, unspecified trimester: Secondary | ICD-10-CM | POA: Insufficient documentation

## 2016-09-19 DIAGNOSIS — Z3482 Encounter for supervision of other normal pregnancy, second trimester: Secondary | ICD-10-CM | POA: Diagnosis not present

## 2016-09-19 DIAGNOSIS — O0930 Supervision of pregnancy with insufficient antenatal care, unspecified trimester: Secondary | ICD-10-CM | POA: Diagnosis not present

## 2016-09-19 DIAGNOSIS — E669 Obesity, unspecified: Secondary | ICD-10-CM

## 2016-09-19 NOTE — Progress Notes (Signed)
Patient stated that she had sharp pains in lower abdomen 2 days ago, but has not felt any since, pt states no contractions or bleeding.

## 2016-09-19 NOTE — Progress Notes (Signed)
   PRENATAL VISIT NOTE  Subjective:  Jodi Mcdonald is a 25 y.o. G3P1011 at 8584w2d being seen today for ongoing prenatal care.  She is currently monitored for the following issues for this low-risk pregnancy and has Encounter for supervision of other normal pregnancy in second trimester; Obesity in pregnancy with antepartum complication; Obesity (BMI 35.0-39.9 without comorbidity) (HCC); and Encounter for supervision of pregnancy with insufficient prenatal care, antepartum on her problem list.  Patient reports no complaints.  Contractions: Not present. Vag. Bleeding: None.  Movement: Present. Denies leaking of fluid.   The following portions of the patient's history were reviewed and updated as appropriate: allergies, current medications, past family history, past medical history, past social history, past surgical history and problem list. Problem list updated.  Objective:   Vitals:   09/19/16 1001  BP: 116/76  Pulse: 83  Temp: 99 F (37.2 C)  Weight: 262 lb (118.8 kg)    Fetal Status: Fetal Heart Rate (bpm): 145 Fundal Height: 31 cm Movement: Present     General:  Alert, oriented and cooperative. Patient is in no acute distress.  Skin: Skin is warm and dry. No rash noted.   Cardiovascular: Normal heart rate noted  Respiratory: Normal respiratory effort, no problems with respiration noted  Abdomen: Soft, gravid, appropriate for gestational age. Pain/Pressure: Absent     Pelvic:  Cervical exam deferred        Extremities: Normal range of motion.  Edema: Trace  Mental Status: Normal mood and affect. Normal behavior. Normal judgment and thought content.   Urinalysis:      Assessment and Plan:  Pregnancy: G3P1011 at 6384w2d  1. Encounter for supervision of other normal pregnancy in second trimester Patient is doing well without complaints Reviewed results of glucola test  2. Obesity in pregnancy with antepartum complication Appropriate weight gain thus far  3. Obesity (BMI  35.0-39.9 without comorbidity) (HCC)   4. Encounter for supervision of pregnancy with insufficient prenatal care, antepartum   Preterm labor symptoms and general obstetric precautions including but not limited to vaginal bleeding, contractions, leaking of fluid and fetal movement were reviewed in detail with the patient. Please refer to After Visit Summary for other counseling recommendations.  Return in about 2 weeks (around 10/03/2016).  Catalina AntiguaPeggy Rubens Cranston, MD

## 2016-09-27 ENCOUNTER — Inpatient Hospital Stay (HOSPITAL_COMMUNITY)
Admission: AD | Admit: 2016-09-27 | Discharge: 2016-09-27 | Disposition: A | Discharge status: Home / Self Care | Payer: 59 | Source: Ambulatory Visit | Attending: Obstetrics and Gynecology | Admitting: Obstetrics and Gynecology

## 2016-09-27 ENCOUNTER — Telehealth: Payer: Self-pay | Admitting: *Deleted

## 2016-09-27 ENCOUNTER — Encounter (HOSPITAL_COMMUNITY): Payer: Self-pay | Admitting: *Deleted

## 2016-09-27 DIAGNOSIS — O98813 Other maternal infectious and parasitic diseases complicating pregnancy, third trimester: Secondary | ICD-10-CM | POA: Insufficient documentation

## 2016-09-27 DIAGNOSIS — B3731 Acute candidiasis of vulva and vagina: Secondary | ICD-10-CM

## 2016-09-27 DIAGNOSIS — Z3A32 32 weeks gestation of pregnancy: Secondary | ICD-10-CM | POA: Insufficient documentation

## 2016-09-27 DIAGNOSIS — B373 Candidiasis of vulva and vagina: Secondary | ICD-10-CM | POA: Insufficient documentation

## 2016-09-27 DIAGNOSIS — R102 Pelvic and perineal pain: Secondary | ICD-10-CM | POA: Diagnosis present

## 2016-09-27 DIAGNOSIS — O26899 Other specified pregnancy related conditions, unspecified trimester: Secondary | ICD-10-CM

## 2016-09-27 DIAGNOSIS — Z87891 Personal history of nicotine dependence: Secondary | ICD-10-CM | POA: Insufficient documentation

## 2016-09-27 HISTORY — DX: Cyst of Bartholin's gland: N75.0

## 2016-09-27 LAB — URINALYSIS, ROUTINE W REFLEX MICROSCOPIC
Bilirubin Urine: NEGATIVE
GLUCOSE, UA: NEGATIVE mg/dL
HGB URINE DIPSTICK: NEGATIVE
Ketones, ur: 15 mg/dL — AB
Nitrite: NEGATIVE
PH: 6 (ref 5.0–8.0)
Protein, ur: 30 mg/dL — AB

## 2016-09-27 LAB — URINE MICROSCOPIC-ADD ON: RBC / HPF: NONE SEEN RBC/hpf (ref 0–5)

## 2016-09-27 LAB — WET PREP, GENITAL
Clue Cells Wet Prep HPF POC: NONE SEEN
Sperm: NONE SEEN
TRICH WET PREP: NONE SEEN

## 2016-09-27 MED ORDER — TERCONAZOLE 0.4 % VA CREA: 1.0000 | TOPICAL_CREAM | Freq: Every day | VAGINAL | 0 refills | Status: DC

## 2016-09-27 NOTE — Discharge Instructions (Signed)

## 2016-09-27 NOTE — Telephone Encounter (Signed)
Patient did OTC treatment for yeast and her symptoms are not cleared. She is requesting an appointment. Offered appointment with provider tomorrow afternoon- patient declines. She took off work today and she can't come tomorrow. She will go elsewhere.

## 2016-09-27 NOTE — MAU Provider Note (Signed)
Chief Complaint:  Vaginal Discharge and Pelvic Pain   First Provider Initiated Contact with Patient 09/27/16 1538     HPI: Jodi Mcdonald is a 25 y.o. G3P1011 at 8w3dwho presents to maternity admissions reporting LLQ pain and cramping for 2 days.  Also has had an itching white discharge for a week. . She reports good fetal movement, denies LOF, vaginal bleeding, urinary symptoms, h/a, dizziness, n/v, diarrhea, constipation or fever/chills.  She denies headache, visual changes or RUQ abdominal pain.  Abdominal Pain  This is a new problem. The current episode started yesterday. The onset quality is gradual. The problem occurs intermittently. The problem has been waxing and waning. The pain is located in the LLQ. The pain is mild. The quality of the pain is cramping. The abdominal pain does not radiate. Pertinent negatives include no constipation, diarrhea, fever, headaches, myalgias, nausea or vomiting. Nothing aggravates the pain. The pain is relieved by nothing. She has tried nothing for the symptoms.  Vaginal Discharge  The patient's primary symptoms include genital itching, pelvic pain and vaginal discharge. The patient's pertinent negatives include no genital lesions or genital odor. This is a new problem. The current episode started in the past 7 days. The problem occurs constantly. The problem has been unchanged. The pain is mild. The problem affects both sides. She is pregnant. Associated symptoms include abdominal pain. Pertinent negatives include no constipation, diarrhea, fever, headaches, nausea or vomiting. The vaginal discharge was white and thick. There has been no bleeding. She has not been passing clots. She has not been passing tissue. Nothing aggravates the symptoms. She has tried nothing for the symptoms. She uses nothing for contraception.   RN Note: C/o L lower pelvic cramping for past 2 days; c/o itchy vaginal discharge for past week;  Past Medical History: Past Medical History:   Diagnosis Date  . Bartholin cyst   . H/O chlamydia infection 2009  . H/O cystitis   . H/O gonorrhea 2012   . History of being obese   . Infection    UTI  . Trichomonas 2009    Past obstetric history: OB History  Gravida Para Term Preterm AB Living  3 1 1  0 1 1  SAB TAB Ectopic Multiple Live Births  1 0 0 0 1    # Outcome Date GA Lbr Len/2nd Weight Sex Delivery Anes PTL Lv  3 Current           2 Term 03/06/12 [redacted]w[redacted]d / 00:21 7 lb 4.9 oz (3.314 kg) M Vag-Spont Local, EPI  LIV     Birth Comments: WNL  1 SAB               Past Surgical History: Past Surgical History:  Procedure Laterality Date  . NO PAST SURGERIES    . WISDOM TOOTH EXTRACTION      Family History: Family History  Problem Relation Age of Onset  . Depression Mother   . Hypertension Mother   . Hypertension Father   . Diabetes Maternal Grandmother   . Diabetes Maternal Aunt   . Cancer Paternal Aunt   . Anesthesia problems Neg Hx     Social History: Social History  Substance Use Topics  . Smoking status: Former Smoker    Packs/day: 0.25    Types: Cigarettes    Quit date: 02/13/2016  . Smokeless tobacco: Former Neurosurgeon     Comment: quit recently  . Alcohol use No     Comment: ocassionally    Allergies: No  Known Allergies  Meds:  Prescriptions Prior to Admission  Medication Sig Dispense Refill Last Dose  . Pren-Fe-Meth-FA-Omeg w/o A (PRIMACARE) 30-1-470 MG CAPS Take 1 tablet by mouth daily. 30 capsule 12 Taking    I have reviewed patient's Past Medical Hx, Surgical Hx, Family Hx, Social Hx, medications and allergies.   ROS:  Review of Systems  Constitutional: Negative for fever.  Gastrointestinal: Positive for abdominal pain. Negative for constipation, diarrhea, nausea and vomiting.  Genitourinary: Positive for pelvic pain and vaginal discharge.  Musculoskeletal: Negative for myalgias.  Neurological: Negative for headaches.   Other systems negative  Physical Exam  No data  found.  Constitutional: Well-developed, well-nourished female in no acute distress.  Cardiovascular: normal rate and rhythm Respiratory: normal effort, clear to auscultation bilaterally GI: Abd soft, non-tender, gravid appropriate for gestational age.   No rebound or guarding. MS: Extremities nontender, no edema, normal ROM Neurologic: Alert and oriented x 4.  GU: Neg CVAT.    Dilation: Closed Effacement (%): Thick Station: -3 Exam by:: Artelia Laroche CNM   FHT:  Baseline 140 , moderate variability, accelerations present, no decelerations Contractions:  Rare with occasional irritability   Labs: Results for orders placed or performed during the hospital encounter of 09/27/16 (from the past 72 hour(s))  Urinalysis, Routine w reflex microscopic (not at [facility])     Status: Abnormal   Collection Time: 09/27/16  3:20 PM  Result Value Ref Range   Color, Urine AMBER (A) YELLOW    Comment: BIOCHEMICALS MAY BE AFFECTED BY COLOR   APPearance HAZY (A) CLEAR   Specific Gravity, Urine >1.030 (H) 1.005 - 1.030   pH 6.0 5.0 - 8.0   Glucose, UA NEGATIVE NEGATIVE mg/dL   Hgb urine dipstick NEGATIVE NEGATIVE   Bilirubin Urine NEGATIVE NEGATIVE   Ketones, ur 15 (A) NEGATIVE mg/dL   Protein, ur 30 (A) NEGATIVE mg/dL   Nitrite NEGATIVE NEGATIVE   Leukocytes, UA MODERATE (A) NEGATIVE  Urine microscopic-add on     Status: Abnormal   Collection Time: 09/27/16  3:20 PM  Result Value Ref Range   Squamous Epithelial / LPF 6-30 (A) NONE SEEN   WBC, UA 6-30 0 - 5 WBC/hpf   RBC / HPF NONE SEEN 0 - 5 RBC/hpf   Bacteria, UA MANY (A) NONE SEEN   Urine-Other YEAST PRESENT     Comment: MUCOUS PRESENT  OB Urine Culture     Status: Abnormal   Collection Time: 09/27/16  3:20 PM  Result Value Ref Range   Specimen Description OB CLEAN CATCH    Special Requests NONE    Culture (A)     MULTIPLE SPECIES PRESENT, SUGGEST RECOLLECTION NO GROUP B STREP (S.AGALACTIAE) ISOLATED Performed at [facility]     Report Status 09/28/2016 FINAL   Wet prep, genital     Status: Abnormal   Collection Time: 09/27/16  3:50 PM  Result Value Ref Range   Yeast Wet Prep HPF POC PRESENT (A) NONE SEEN   Trich, Wet Prep NONE SEEN NONE SEEN   Clue Cells Wet Prep HPF POC NONE SEEN NONE SEEN   WBC, Wet Prep HPF POC MANY (A) NONE SEEN    Comment: BACTERIA- TOO NUMEROUS TO COUNT   Sperm NONE SEEN     Imaging:  No results found.  MAU Course/MDM: I have ordered labs and reviewed results.   Wet prep showed yeast vaginitis.  UA normal, no dehydration NST reviewed No preterm labor seen on monitor. Cervix closed  Suspect cramping might be round ligament pain  Assessment: SIUP at 2398w3d Pelvic cramping Yeast vaginitis   Plan: Discharge home Preterm Labor precautions and fetal kick counts Follow up in Office for prenatal visits and recheck of cervix Rx Terazole 7 for yeast vaginitis Encouraged to return here or to other Urgent Care/ED if she develops worsening of symptoms, increase in pain, fever, or other concerning symptoms.       Pt stable at time of discharge.  Wynelle BourgeoisMarie Redford Behrle CNM, MSN Certified Nurse-Midwife 09/27/2016 3:38 PM

## 2016-09-27 NOTE — MAU Note (Signed)
C/o L lower pelvic cramping for past 2 days; c/o itchy vaginal discharge for past week;

## 2016-09-28 LAB — CULTURE, OB URINE

## 2016-10-04 ENCOUNTER — Ambulatory Visit (INDEPENDENT_AMBULATORY_CARE_PROVIDER_SITE_OTHER): Payer: 59 | Admitting: Certified Nurse Midwife

## 2016-10-04 VITALS — BP 113/73 | HR 85 | Wt 264.0 lb

## 2016-10-04 DIAGNOSIS — O99213 Obesity complicating pregnancy, third trimester: Secondary | ICD-10-CM | POA: Diagnosis not present

## 2016-10-04 DIAGNOSIS — Z3483 Encounter for supervision of other normal pregnancy, third trimester: Secondary | ICD-10-CM

## 2016-10-04 DIAGNOSIS — E669 Obesity, unspecified: Secondary | ICD-10-CM | POA: Diagnosis not present

## 2016-10-04 DIAGNOSIS — O99210 Obesity complicating pregnancy, unspecified trimester: Secondary | ICD-10-CM

## 2016-10-04 NOTE — Patient Instructions (Addendum)
Third Trimester of Pregnancy The third trimester is from week 29 through week 42, months 7 through 9. The third trimester is a time when the fetus is growing rapidly. At the end of the ninth month, the fetus is about 20 inches in length and weighs 6-10 pounds.  BODY CHANGES Your body goes through many changes during pregnancy. The changes vary from woman to woman.   Your weight will continue to increase. You can expect to gain 25-35 pounds (11-16 kg) by the end of the pregnancy.  You may begin to get stretch marks on your hips, abdomen, and breasts.  You may urinate more often because the fetus is moving lower into your pelvis and pressing on your bladder.  You may develop or continue to have heartburn as a result of your pregnancy.  You may develop constipation because certain hormones are causing the muscles that push waste through your intestines to slow down.  You may develop hemorrhoids or swollen, bulging veins (varicose veins).  You may have pelvic pain because of the weight gain and pregnancy hormones relaxing your joints between the bones in your pelvis. Backaches may result from overexertion of the muscles supporting your posture.  You may have changes in your hair. These can include thickening of your hair, rapid growth, and changes in texture. Some women also have hair loss during or after pregnancy, or hair that feels dry or thin. Your hair will most likely return to normal after your baby is born.  Your breasts will continue to grow and be tender. A yellow discharge may leak from your breasts called colostrum.  Your belly button may stick out.  You may feel short of breath because of your expanding uterus.  You may notice the fetus "dropping," or moving lower in your abdomen.  You may have a bloody mucus discharge. This usually occurs a few days to a week before labor begins.  Your cervix becomes thin and soft (effaced) near your due date. WHAT TO EXPECT AT YOUR PRENATAL  EXAMS  You will have prenatal exams every 2 weeks until week 36. Then, you will have weekly prenatal exams. During a routine prenatal visit:  You will be weighed to make sure you and the fetus are growing normally.  Your blood pressure is taken.  Your abdomen will be measured to track your baby's growth.  The fetal heartbeat will be listened to.  Any test results from the previous visit will be discussed.  You may have a cervical check near your due date to see if you have effaced. At around 36 weeks, your caregiver will check your cervix. At the same time, your caregiver will also perform a test on the secretions of the vaginal tissue. This test is to determine if a type of bacteria, Group B streptococcus, is present. Your caregiver will explain this further. Your caregiver may ask you:  What your birth plan is.  How you are feeling.  If you are feeling the baby move.  If you have had any abnormal symptoms, such as leaking fluid, bleeding, severe headaches, or abdominal cramping.  If you are using any tobacco products, including cigarettes, chewing tobacco, and electronic cigarettes.  If you have any questions. Other tests or screenings that may be performed during your third trimester include:  Blood tests that check for low iron levels (anemia).  Fetal testing to check the health, activity level, and growth of the fetus. Testing is done if you have certain medical conditions or if   there are problems during the pregnancy.  HIV (human immunodeficiency virus) testing. If you are at high risk, you may be screened for HIV during your third trimester of pregnancy. FALSE LABOR You may feel small, irregular contractions that eventually go away. These are called Braxton Hicks contractions, or false labor. Contractions may last for hours, days, or even weeks before true labor sets in. If contractions come at regular intervals, intensify, or become painful, it is best to be seen by your  caregiver.  SIGNS OF LABOR   Menstrual-like cramps.  Contractions that are 5 minutes apart or less.  Contractions that start on the top of the uterus and spread down to the lower abdomen and back.  A sense of increased pelvic pressure or back pain.  A watery or bloody mucus discharge that comes from the vagina. If you have any of these signs before the 37th week of pregnancy, call your caregiver right away. You need to go to the hospital to get checked immediately. HOME CARE INSTRUCTIONS   Avoid all smoking, herbs, alcohol, and unprescribed drugs. These chemicals affect the formation and growth of the baby.  Do not use any tobacco products, including cigarettes, chewing tobacco, and electronic cigarettes. If you need help quitting, ask your health care provider. You may receive counseling support and other resources to help you quit.  Follow your caregiver's instructions regarding medicine use. There are medicines that are either safe or unsafe to take during pregnancy.  Exercise only as directed by your caregiver. Experiencing uterine cramps is a good sign to stop exercising.  Continue to eat regular, healthy meals.  Wear a good support bra for breast tenderness.  Do not use hot tubs, steam rooms, or saunas.  Wear your seat belt at all times when driving.  Avoid raw meat, uncooked cheese, cat litter boxes, and soil used by cats. These carry germs that can cause birth defects in the baby.  Take your prenatal vitamins.  Take 1500-2000 mg of calcium daily starting at the 20th week of pregnancy until you deliver your baby.  Try taking a stool softener (if your caregiver approves) if you develop constipation. Eat more high-fiber foods, such as fresh vegetables or fruit and whole grains. Drink plenty of fluids to keep your urine clear or pale yellow.  Take warm sitz baths to soothe any pain or discomfort caused by hemorrhoids. Use hemorrhoid cream if your caregiver approves.  If  you develop varicose veins, wear support hose. Elevate your feet for 15 minutes, 3-4 times a day. Limit salt in your diet.  Avoid heavy lifting, wear low heal shoes, and practice good posture.  Rest a lot with your legs elevated if you have leg cramps or low back pain.  Visit your dentist if you have not gone during your pregnancy. Use a soft toothbrush to brush your teeth and be gentle when you floss.  A sexual relationship may be continued unless your caregiver directs you otherwise.  Do not travel far distances unless it is absolutely necessary and only with the approval of your caregiver.  Take prenatal classes to understand, practice, and ask questions about the labor and delivery.  Make a trial run to the hospital.  Pack your hospital bag.  Prepare the baby's nursery.  Continue to go to all your prenatal visits as directed by your caregiver. SEEK MEDICAL CARE IF:  You are unsure if you are in labor or if your water has broken.  You have dizziness.  You have  mild pelvic cramps, pelvic pressure, or nagging pain in your abdominal area.  You have persistent nausea, vomiting, or diarrhea.  You have a bad smelling vaginal discharge.  You have pain with urination. SEEK IMMEDIATE MEDICAL CARE IF:   You have a fever.  You are leaking fluid from your vagina.  You have spotting or bleeding from your vagina.  You have severe abdominal cramping or pain.  You have rapid weight loss or gain.  You have shortness of breath with chest pain.  You notice sudden or extreme swelling of your face, hands, ankles, feet, or legs.  You have not felt your baby move in over an hour.  You have severe headaches that do not go away with medicine.  You have vision changes.   This information is not intended to replace advice given to you by your health care provider. Make sure you discuss any questions you have with your health care provider.   Document Released: 12/06/2001 Document  Revised: 01/02/2015 Document Reviewed: 02/12/2013 Elsevier Interactive Patient Education 2016 [employer]. Preterm Labor Information Preterm labor is when labor starts at less than 37 weeks of pregnancy. The normal length of a pregnancy is 39 to 41 weeks. CAUSES Often, there is no identifiable underlying cause as to why a woman goes into preterm labor. One of the most common known causes of preterm labor is infection. Infections of the uterus, cervix, vagina, amniotic sac, bladder, kidney, or even the lungs (pneumonia) can cause labor to start. Other suspected causes of preterm labor include:   Urogenital infections, such as yeast infections and bacterial vaginosis.   Uterine abnormalities (uterine shape, uterine septum, fibroids, or bleeding from the placenta).   A cervix that has been operated on (it may fail to stay closed).   Malformations in the fetus.   Multiple gestations (twins, triplets, and so on).   Breakage of the amniotic sac.  RISK FACTORS  Having a previous history of preterm labor.   Having premature rupture of membranes (PROM).   Having a placenta that covers the opening of the cervix (placenta previa).   Having a placenta that separates from the uterus (placental abruption).   Having a cervix that is too weak to hold the fetus in the uterus (incompetent cervix).   Having too much fluid in the amniotic sac (polyhydramnios).   Taking illegal drugs or smoking while pregnant.   Not gaining enough weight while pregnant.   Being younger than 63 and older than 23 years old.   Having a low socioeconomic status.   Being African American. SYMPTOMS Signs and symptoms of preterm labor include:   Menstrual-like cramps, abdominal pain, or back pain.  Uterine contractions that are regular, as frequent as six in an hour, regardless of their intensity (may be mild or painful).  Contractions that start on the top of the uterus and spread down to the  lower abdomen and back.   A sense of increased pelvic pressure.   A watery or bloody mucus discharge that comes from the vagina.  TREATMENT Depending on the length of the pregnancy and other circumstances, your health care provider may suggest bed rest. If necessary, there are medicines that can be given to stop contractions and to mature the fetal lungs. If labor happens before 34 weeks of pregnancy, a prolonged hospital stay may be recommended. Treatment depends on the condition of both you and the fetus.  WHAT SHOULD YOU DO IF YOU THINK YOU ARE IN PRETERM LABOR? Call your  health care provider right away. You will need to go to the hospital to get checked immediately. HOW CAN YOU PREVENT PRETERM LABOR IN FUTURE PREGNANCIES? You should:   Stop smoking if you smoke.  Maintain healthy weight gain and avoid chemicals and drugs that are not necessary.  Be watchful for any type of infection.  Inform your health care provider if you have a known history of preterm labor.   This information is not intended to replace advice given to you by your health care provider. Make sure you discuss any questions you have with your health care provider.   Document Released: 03/03/2004 Document Revised: 08/14/2013 Document Reviewed: 01/14/2013 Elsevier Interactive Patient Education 2016 [employer].  Vaginal Delivery During delivery, your health care provider will help you give birth to your baby. During a vaginal delivery, you will work to push the baby out of your vagina. However, before you can push your baby out, a few things need to happen. The opening of your uterus (cervix) has to soften, thin out, and open up (dilate) all the way to 10 cm. Also, your baby has to move down from the uterus into your vagina.  SIGNS OF LABOR  Your health care provider will first need to make sure you are in labor. Signs of labor include:   Passing what is called the mucous plug before labor begins. This is a  small amount of blood-stained mucus.  Having regular, painful uterine contractions.   The time between contractions gets shorter.   The discomfort and pain gradually get more intense.  Contraction pains get worse when walking and do not go away when resting.   Your cervix becomes thinner (effacement) and dilates. BEFORE THE DELIVERY Once you are in labor and admitted into the hospital or care center, your health care provider may do the following:   Perform a complete physical exam.  Review any complications related to pregnancy or labor.  Check your blood pressure, pulse, temperature, and heart rate (vital signs).   Determine if, and when, the rupture of amniotic membranes occurred.  Do a vaginal exam (using a sterile glove and lubricant) to determine:   The position (presentation) of the baby. Is the baby's head presenting first (vertex) in the birth canal (vagina), or are the feet or buttocks first (breech)?   The level (station) of the baby's head within the birth canal.   The effacement and dilatation of the cervix.   An electronic fetal monitor is usually placed on your abdomen when you first arrive. This is used to monitor your contractions and the baby's heart rate.  When the monitor is on your abdomen (external fetal monitor), it can only pick up the frequency and length of your contractions. It cannot tell the strength of your contractions.  If it becomes necessary for your health care provider to know exactly how strong your contractions are or to see exactly what the baby's heart rate is doing, an internal monitor may be inserted into your vagina and uterus. Your health care provider will discuss the benefits and risks of using an internal monitor and obtain your permission before inserting the device.  Continuous fetal monitoring may be needed if you have an epidural, are receiving certain medicines (such as oxytocin), or have pregnancy or labor  complications.  An IV access tube may be placed into a vein in your arm to deliver fluids and medicines if necessary. THREE STAGES OF LABOR AND DELIVERY Normal labor and delivery is divided  into three stages. First Stage This stage starts when you begin to contract regularly and your cervix begins to efface and dilate. It ends when your cervix is completely open (fully dilated). The first stage is the longest stage of labor and can last from 3 hours to 15 hours.  Several methods are available to help with labor pain. You and your health care provider will decide which option is best for you. Options include:   Opioid medicines. These are strong pain medicines that you can get through your IV tube or as a shot into your muscle. These medicines lessen pain but do not make it go away completely.  Epidural. A medicine is given through a thin tube that is inserted in your back. The medicine numbs the lower part of your body and prevents any pain in that area.  Paracervical pain medicine. This is an injection of an anesthetic on each side of your cervix.   You may request natural childbirth, which does not involve the use of pain medicines or an epidural during labor and delivery. Instead, you will use other things, such as breathing exercises, to help cope with the pain. Second Stage The second stage of labor begins when your cervix is fully dilated at 10 cm. It continues until you push your baby down through the birth canal and the baby is born. This stage can take only minutes or several hours.  The location of your baby's head as it moves through the birth canal is reported as a number called a station. If the baby's head has not started its descent, the station is described as being at minus 3 (-3). When your baby's head is at the zero station, it is at the middle of the birth canal and is engaged in the pelvis. The station of your baby helps indicate the progress of the second stage of  labor.  When your baby is born, your health care provider may hold the baby with his or her head lowered to prevent amniotic fluid, mucus, and blood from getting into the baby's lungs. The baby's mouth and nose may be suctioned with a small bulb syringe to remove any additional fluid.  Your health care provider may then place the baby on your stomach. It is important to keep the baby from getting cold. To do this, the health care provider will dry the baby off, place the baby directly on your skin (with no blankets between you and the baby), and cover the baby with warm, dry blankets.   The umbilical cord is cut. Third Stage During the third stage of labor, your health care provider will deliver the placenta (afterbirth) and make sure your bleeding is under control. The delivery of the placenta usually takes about 5 minutes but can take up to 30 minutes. After the placenta is delivered, a medicine may be given either by IV or injection to help contract the uterus and control bleeding. If you are planning to breastfeed, you can try to do so now. After you deliver the placenta, your uterus should contract and get very firm. If your uterus does not remain firm, your health care provider will massage it. This is important because the contraction of the uterus helps cut off bleeding at the site where the placenta was attached to your uterus. If your uterus does not contract properly and stay firm, you may continue to bleed heavily. If there is a lot of bleeding, medicines may be given to contract the uterus  and stop the bleeding.    This information is not intended to replace advice given to you by your health care provider. Make sure you discuss any questions you have with your health care provider.   Document Released: 09/20/2008 Document Revised: 01/02/2015 Document Reviewed: 08/08/2012 Elsevier Interactive Patient Education [employer]2016 Elsevier Inc.

## 2016-10-04 NOTE — Progress Notes (Signed)
Subjective:    Jodi Mcdonald is a 25 y.o. female being seen today for her obstetrical visit. She is at 5971w3d gestation. Patient reports no complaints. Fetal movement: normal.  Problem List Items Addressed This Visit    None    Visit Diagnoses    Encounter for supervision of other normal pregnancy in third trimester    -  Primary   Maternal obesity affecting pregnancy, antepartum         Patient Active Problem List   Diagnosis Date Noted  . Obesity in pregnancy with antepartum complication 09/19/2016  . Obesity (BMI 35.0-39.9 without comorbidity) 09/19/2016  . Encounter for supervision of pregnancy with insufficient prenatal care, antepartum 09/19/2016  . Supervision of other normal pregnancy, antepartum 06/08/2016   Objective:    BP 113/73   Pulse 85   Wt 264 lb (119.7 kg)   LMP 02/13/2016 (Exact Date)   BMI 42.61 kg/m  FHT:  158 BPM  Uterine Size: 34 cm and size equals dates  Presentation: cephalic     Assessment:    Pregnancy @ 3271w3d weeks   maternal obesity  Plan:     labs reviewed, problem list updated Consent signed. GBS planning TDAP offered  Rhogam given for RH negative Pediatrician: discussed. Infant feeding: plans to breastfeed. Maternity leave: discussed. Cigarette smoking: never smoked. No orders of the defined types were placed in this encounter.  No orders of the defined types were placed in this encounter.  Follow up in 2 Weeks with GBS cultures.

## 2016-10-18 ENCOUNTER — Encounter: Payer: 59 | Admitting: Certified Nurse Midwife

## 2016-10-24 ENCOUNTER — Ambulatory Visit (INDEPENDENT_AMBULATORY_CARE_PROVIDER_SITE_OTHER): Payer: 59 | Admitting: Obstetrics and Gynecology

## 2016-10-24 VITALS — BP 119/86 | HR 79 | Temp 98.0°F | Wt 271.4 lb

## 2016-10-24 DIAGNOSIS — E669 Obesity, unspecified: Secondary | ICD-10-CM

## 2016-10-24 DIAGNOSIS — O99213 Obesity complicating pregnancy, third trimester: Secondary | ICD-10-CM | POA: Diagnosis not present

## 2016-10-24 DIAGNOSIS — O99210 Obesity complicating pregnancy, unspecified trimester: Secondary | ICD-10-CM

## 2016-10-24 DIAGNOSIS — Z113 Encounter for screening for infections with a predominantly sexual mode of transmission: Secondary | ICD-10-CM | POA: Diagnosis not present

## 2016-10-24 DIAGNOSIS — Z3480 Encounter for supervision of other normal pregnancy, unspecified trimester: Secondary | ICD-10-CM

## 2016-10-24 LAB — OB RESULTS CONSOLE GC/CHLAMYDIA: Gonorrhea: NEGATIVE

## 2016-10-24 NOTE — Progress Notes (Signed)
Patient is in the office for routine ob visit, states that mucous plug has been coming out since last week, reports good fetal movement.

## 2016-10-24 NOTE — Progress Notes (Signed)
   PRENATAL VISIT NOTE  Subjective:  Jodi Mcdonald is a 25 y.o. G3P1011 at 2026w2d being seen today for ongoing prenatal care.  She is currently monitored for the following issues for this low-risk pregnancy and has Supervision of other normal pregnancy, antepartum; Obesity in pregnancy with antepartum complication; Obesity (BMI 35.0-39.9 without comorbidity); and Encounter for supervision of pregnancy with insufficient prenatal care, antepartum on her problem list.  Patient reports no complaints.  Contractions: Irritability. Vag. Bleeding: None.  Movement: Present. Denies leaking of fluid.   The following portions of the patient's history were reviewed and updated as appropriate: allergies, current medications, past family history, past medical history, past social history, past surgical history and problem list. Problem list updated.  Objective:   Vitals:   10/24/16 0939  BP: 119/86  Pulse: 79  Temp: 98 F (36.7 C)  Weight: 271 lb 6.4 oz (123.1 kg)    Fetal Status: Fetal Heart Rate (bpm): 144 Fundal Height: 36 cm Movement: Present  Presentation: Vertex  General:  Alert, oriented and cooperative. Patient is in no acute distress.  Skin: Skin is warm and dry. No rash noted.   Cardiovascular: Normal heart rate noted  Respiratory: Normal respiratory effort, no problems with respiration noted  Abdomen: Soft, gravid, appropriate for gestational age. Pain/Pressure: Absent     Pelvic:  Cervical exam performed Dilation: Closed Effacement (%): Thick Station: Ballotable  Extremities: Normal range of motion.  Edema: Trace  Mental Status: Normal mood and affect. Normal behavior. Normal judgment and thought content.   Assessment and Plan:  Pregnancy: G3P1011 at 3826w2d  1. Supervision of other normal pregnancy, antepartum Patient is doing well without complaints Cultures collected today - Culture, beta strep (group b only) - GC/Chlamydia probe amp ([place]  2. Obesity in  pregnancy with antepartum complication   Preterm labor symptoms and general obstetric precautions including but not limited to vaginal bleeding, contractions, leaking of fluid and fetal movement were reviewed in detail with the patient. Please refer to After Visit Summary for other counseling recommendations.  Return in about 1 week (around 10/31/2016).  Catalina AntiguaPeggy Tabius Rood, MD

## 2016-10-25 LAB — GC/CHLAMYDIA PROBE AMP (~~LOC~~) NOT AT ARMC
Chlamydia: NEGATIVE
Neisseria Gonorrhea: NEGATIVE

## 2016-10-28 LAB — OB RESULTS CONSOLE GBS: STREP GROUP B AG: NEGATIVE

## 2016-10-28 LAB — CULTURE, BETA STREP (GROUP B ONLY): STREP GP B CULTURE: NEGATIVE

## 2016-11-03 ENCOUNTER — Ambulatory Visit (INDEPENDENT_AMBULATORY_CARE_PROVIDER_SITE_OTHER): Payer: 59 | Admitting: Obstetrics & Gynecology

## 2016-11-03 DIAGNOSIS — Z3483 Encounter for supervision of other normal pregnancy, third trimester: Secondary | ICD-10-CM | POA: Diagnosis not present

## 2016-11-03 DIAGNOSIS — Z3480 Encounter for supervision of other normal pregnancy, unspecified trimester: Secondary | ICD-10-CM

## 2016-11-03 DIAGNOSIS — Z3493 Encounter for supervision of normal pregnancy, unspecified, third trimester: Secondary | ICD-10-CM

## 2016-11-03 NOTE — Patient Instructions (Signed)
Return to clinic for any scheduled appointments or obstetric concerns, or go to [facility] for evaluation  

## 2016-11-03 NOTE — Progress Notes (Signed)
   PRENATAL VISIT NOTE  Subjective:  Jodi Mcdonald is a 25 y.o. G3P1011 at 7215w5d being seen today for ongoing prenatal care.  She is currently monitored for the following issues for this low-risk pregnancy and has Supervision of other normal pregnancy, antepartum; Obesity in pregnancy with antepartum complication; Obesity (BMI 35.0-39.9 without comorbidity); and Encounter for supervision of pregnancy with insufficient prenatal care, antepartum on her problem list.  Patient reports occasional contractions.  Contractions: Irregular. Vag. Bleeding: None.  Movement: Present. Denies leaking of fluid.   The following portions of the patient's history were reviewed and updated as appropriate: allergies, current medications, past family history, past medical history, past social history, past surgical history and problem list. Problem list updated.  Objective:   Vitals:   11/03/16 1620  BP: 127/87  Pulse: 84  Temp: 98.2 F (36.8 C)  Weight: 273 lb 12.8 oz (124.2 kg)    Fetal Status: Fetal Heart Rate (bpm): 147   Movement: Present     General:  Alert, oriented and cooperative. Patient is in no acute distress.  Skin: Skin is warm and dry. No rash noted.   Cardiovascular: Normal heart rate noted  Respiratory: Normal respiratory effort, no problems with respiration noted  Abdomen: Soft, gravid, appropriate for gestational age. Pain/Pressure: Present     Pelvic:  Cervical exam deferred        Extremities: Normal range of motion.  Edema: Trace  Mental Status: Normal mood and affect. Normal behavior. Normal judgment and thought content.   Assessment and Plan:  Pregnancy: G3P1011 at 6515w5d  1. Supervision of other normal pregnancy, antepartum Term labor symptoms and general obstetric precautions including but not limited to vaginal bleeding, contractions, leaking of fluid and fetal movement were reviewed in detail with the patient. Please refer to After Visit Summary for other counseling  recommendations.  Return in about 1 week (around 11/10/2016) for OB Visit.   Tereso NewcomerUgonna A Aundray Cartlidge, MD\

## 2016-11-10 ENCOUNTER — Telehealth: Payer: Self-pay

## 2016-11-10 NOTE — Telephone Encounter (Signed)
Called and informed patient that she can pick up her FMLA paperwork.

## 2016-11-11 ENCOUNTER — Ambulatory Visit (INDEPENDENT_AMBULATORY_CARE_PROVIDER_SITE_OTHER): Payer: 59 | Admitting: Obstetrics

## 2016-11-11 ENCOUNTER — Encounter: Payer: Self-pay | Admitting: Obstetrics

## 2016-11-11 VITALS — BP 120/89 | HR 93 | Temp 98.3°F | Wt 270.8 lb

## 2016-11-11 DIAGNOSIS — E669 Obesity, unspecified: Secondary | ICD-10-CM

## 2016-11-11 DIAGNOSIS — O99213 Obesity complicating pregnancy, third trimester: Secondary | ICD-10-CM

## 2016-11-11 DIAGNOSIS — Z3480 Encounter for supervision of other normal pregnancy, unspecified trimester: Secondary | ICD-10-CM

## 2016-11-11 DIAGNOSIS — O99210 Obesity complicating pregnancy, unspecified trimester: Secondary | ICD-10-CM

## 2016-11-11 NOTE — Progress Notes (Signed)
Patient c/o contractions last night about 5 minutes apart, Pt. Lost mucous plug yesterday

## 2016-11-11 NOTE — Progress Notes (Signed)
Subjective:    Jodi Mcdonald is a 25 y.o. female being seen today for her obstetrical visit. She is at 7066w6d gestation. Patient reports occasional contractions. Fetal movement: normal.  Problem List Items Addressed This Visit    Supervision of other normal pregnancy, antepartum - Primary (Chronic)    Other Visit Diagnoses    Maternal obesity affecting pregnancy, antepartum         Patient Active Problem List   Diagnosis Date Noted  . Obesity in pregnancy with antepartum complication 09/19/2016  . Obesity (BMI 35.0-39.9 without comorbidity) 09/19/2016  . Encounter for supervision of pregnancy with insufficient prenatal care, antepartum 09/19/2016  . Supervision of other normal pregnancy, antepartum 06/08/2016    Objective:    BP 120/89   Pulse 93   Temp 98.3 F (36.8 C)   Wt 270 lb 12.8 oz (122.8 kg)   LMP 02/13/2016 (Exact Date)   BMI 43.71 kg/m  FHT: 140 BPM  Uterine Size: size equals dates    Assessment:    Pregnancy @ 4766w6d weeks   Plan:   Plans for delivery: Vaginal anticipated; labs reviewed; problem list updated Counseling: Consent signed. Infant feeding: plans to breastfeed. Cigarette smoking: former smoker. L&D discussion: symptoms of labor, discussed when to call, discussed what number to call, anesthetic/analgesic options reviewed and delivering clinician:  plans no preference. Postpartum supports and preparation: circumcision discussed and contraception plans discussed.  Follow up in 1 Week.

## 2016-11-22 ENCOUNTER — Inpatient Hospital Stay (HOSPITAL_COMMUNITY)
Admission: AD | Admit: 2016-11-22 | Discharge: 2016-11-26 | DRG: 774 | Disposition: A | Discharge status: Home / Self Care | Payer: 59 | Source: Ambulatory Visit | Attending: Family Medicine | Admitting: Family Medicine

## 2016-11-22 ENCOUNTER — Encounter (HOSPITAL_COMMUNITY): Payer: Self-pay | Admitting: *Deleted

## 2016-11-22 ENCOUNTER — Ambulatory Visit (INDEPENDENT_AMBULATORY_CARE_PROVIDER_SITE_OTHER): Payer: 59 | Admitting: Certified Nurse Midwife

## 2016-11-22 VITALS — BP 159/105 | HR 76 | Wt 274.0 lb

## 2016-11-22 DIAGNOSIS — O99214 Obesity complicating childbirth: Secondary | ICD-10-CM | POA: Diagnosis present

## 2016-11-22 DIAGNOSIS — O1414 Severe pre-eclampsia complicating childbirth: Principal | ICD-10-CM | POA: Diagnosis present

## 2016-11-22 DIAGNOSIS — D62 Acute posthemorrhagic anemia: Secondary | ICD-10-CM | POA: Diagnosis not present

## 2016-11-22 DIAGNOSIS — O0930 Supervision of pregnancy with insufficient antenatal care, unspecified trimester: Secondary | ICD-10-CM

## 2016-11-22 DIAGNOSIS — Z833 Family history of diabetes mellitus: Secondary | ICD-10-CM | POA: Diagnosis not present

## 2016-11-22 DIAGNOSIS — O9081 Anemia of the puerperium: Secondary | ICD-10-CM | POA: Diagnosis not present

## 2016-11-22 DIAGNOSIS — O133 Gestational [pregnancy-induced] hypertension without significant proteinuria, third trimester: Secondary | ICD-10-CM

## 2016-11-22 DIAGNOSIS — O139 Gestational [pregnancy-induced] hypertension without significant proteinuria, unspecified trimester: Secondary | ICD-10-CM | POA: Diagnosis present

## 2016-11-22 DIAGNOSIS — O99210 Obesity complicating pregnancy, unspecified trimester: Secondary | ICD-10-CM

## 2016-11-22 DIAGNOSIS — Z87891 Personal history of nicotine dependence: Secondary | ICD-10-CM | POA: Diagnosis not present

## 2016-11-22 DIAGNOSIS — Z8249 Family history of ischemic heart disease and other diseases of the circulatory system: Secondary | ICD-10-CM

## 2016-11-22 DIAGNOSIS — O480 Post-term pregnancy: Secondary | ICD-10-CM | POA: Diagnosis not present

## 2016-11-22 DIAGNOSIS — Z3A40 40 weeks gestation of pregnancy: Secondary | ICD-10-CM

## 2016-11-22 DIAGNOSIS — Z6841 Body Mass Index (BMI) 40.0 and over, adult: Secondary | ICD-10-CM

## 2016-11-22 DIAGNOSIS — R030 Elevated blood-pressure reading, without diagnosis of hypertension: Secondary | ICD-10-CM | POA: Diagnosis present

## 2016-11-22 LAB — PROTEIN / CREATININE RATIO, URINE
CREATININE, URINE: 213 mg/dL
Protein Creatinine Ratio: 0.54 mg/mg{Cre} — ABNORMAL HIGH (ref 0.00–0.15)
TOTAL PROTEIN, URINE: 116 mg/dL

## 2016-11-22 LAB — TYPE AND SCREEN
ABO/RH(D): O POS
Antibody Screen: NEGATIVE

## 2016-11-22 LAB — CBC
HCT: 37.3 % (ref 36.0–46.0)
Hemoglobin: 12.8 g/dL (ref 12.0–15.0)
MCH: 27.4 pg (ref 26.0–34.0)
MCHC: 34.3 g/dL (ref 30.0–36.0)
MCV: 79.9 fL (ref 78.0–100.0)
PLATELETS: 299 10*3/uL (ref 150–400)
RBC: 4.67 MIL/uL (ref 3.87–5.11)
RDW: 15 % (ref 11.5–15.5)
WBC: 9.6 10*3/uL (ref 4.0–10.5)

## 2016-11-22 LAB — COMPREHENSIVE METABOLIC PANEL
ALBUMIN: 2.7 g/dL — AB (ref 3.5–5.0)
ALK PHOS: 111 U/L (ref 38–126)
ALT: 11 U/L — AB (ref 14–54)
AST: 15 U/L (ref 15–41)
Anion gap: 7 (ref 5–15)
BILIRUBIN TOTAL: 0.4 mg/dL (ref 0.3–1.2)
BUN: 8 mg/dL (ref 6–20)
CALCIUM: 8.9 mg/dL (ref 8.9–10.3)
CO2: 21 mmol/L — AB (ref 22–32)
CREATININE: 0.66 mg/dL (ref 0.44–1.00)
Chloride: 105 mmol/L (ref 101–111)
GFR calc Af Amer: >60 mL/min (ref >60)
GFR calc non Af Amer: >60 mL/min (ref >60)
GLUCOSE: 105 mg/dL — AB (ref 65–99)
Potassium: 3.9 mmol/L (ref 3.5–5.1)
Sodium: 133 mmol/L — ABNORMAL LOW (ref 135–145)
TOTAL PROTEIN: 6.1 g/dL — AB (ref 6.5–8.1)

## 2016-11-22 LAB — MRSA PCR SCREENING: MRSA by PCR: NEGATIVE

## 2016-11-22 MED ORDER — MISOPROSTOL 25 MCG QUARTER TABLET
25.0000 ug | ORAL_TABLET | ORAL | Status: DC | PRN
  Administered 2016-11-22: 25 ug via VAGINAL
  Filled 2016-11-22 (×2): qty 0.25

## 2016-11-22 MED ORDER — TERBUTALINE SULFATE 1 MG/ML IJ SOLN: 0.2500 mg | Freq: Once | INTRAMUSCULAR | Status: DC | PRN

## 2016-11-22 MED ORDER — DIPHENHYDRAMINE HCL 50 MG/ML IJ SOLN: 12.5000 mg | INTRAMUSCULAR | Status: DC | PRN

## 2016-11-22 MED ORDER — OXYTOCIN 40 UNITS IN LACTATED RINGERS INFUSION - SIMPLE MED
2.5000 [IU]/h | INTRAVENOUS | Status: DC
  Filled 2016-11-22: qty 1000

## 2016-11-22 MED ORDER — SOD CITRATE-CITRIC ACID 500-334 MG/5ML PO SOLN: 30.0000 mL | ORAL | Status: DC | PRN

## 2016-11-22 MED ORDER — LACTATED RINGERS IV SOLN: 500.0000 mL | Freq: Once | INTRAVENOUS | Status: DC

## 2016-11-22 MED ORDER — PHENYLEPHRINE 40 MCG/ML (10ML) SYRINGE FOR IV PUSH (FOR BLOOD PRESSURE SUPPORT)
80.0000 ug | PREFILLED_SYRINGE | INTRAVENOUS | Status: DC | PRN
  Filled 2016-11-22: qty 10

## 2016-11-22 MED ORDER — EPHEDRINE 5 MG/ML INJ
10.0000 mg | INTRAVENOUS | Status: DC | PRN
Start: 2016-11-22 — End: 2016-11-23

## 2016-11-22 MED ORDER — OXYCODONE-ACETAMINOPHEN 5-325 MG PO TABS: 2.0000 | ORAL_TABLET | ORAL | Status: DC | PRN

## 2016-11-22 MED ORDER — LACTATED RINGERS IV SOLN: 500.0000 mL | INTRAVENOUS | Status: DC | PRN

## 2016-11-22 MED ORDER — LIDOCAINE HCL (PF) 1 % IJ SOLN
30.0000 mL | INTRAMUSCULAR | Status: DC | PRN
  Filled 2016-11-22: qty 30

## 2016-11-22 MED ORDER — ACETAMINOPHEN 325 MG PO TABS: 650.0000 mg | ORAL_TABLET | ORAL | Status: DC | PRN

## 2016-11-22 MED ORDER — LABETALOL HCL 5 MG/ML IV SOLN
20.0000 mg | INTRAVENOUS | Status: DC | PRN
  Administered 2016-11-23: 20 mg via INTRAVENOUS
  Filled 2016-11-22: qty 4

## 2016-11-22 MED ORDER — LACTATED RINGERS IV SOLN
INTRAVENOUS | Status: DC
  Administered 2016-11-22 – 2016-11-23 (×2): via INTRAVENOUS

## 2016-11-22 MED ORDER — ONDANSETRON HCL 4 MG/2ML IJ SOLN: 4.0000 mg | Freq: Four times a day (QID) | INTRAMUSCULAR | Status: DC | PRN

## 2016-11-22 MED ORDER — FENTANYL 2.5 MCG/ML BUPIVACAINE 1/10 % EPIDURAL INFUSION (WH - ANES)
14.0000 mL/h | INTRAMUSCULAR | Status: DC | PRN
  Administered 2016-11-23: 14 mL/h via EPIDURAL
  Filled 2016-11-22: qty 100

## 2016-11-22 MED ORDER — OXYTOCIN BOLUS FROM INFUSION
500.0000 mL | Freq: Once | INTRAVENOUS | Status: AC
  Administered 2016-11-23: 500 mL via INTRAVENOUS

## 2016-11-22 MED ORDER — FENTANYL CITRATE (PF) 100 MCG/2ML IJ SOLN
50.0000 ug | INTRAMUSCULAR | Status: DC | PRN
  Administered 2016-11-23: 100 ug via INTRAVENOUS
  Filled 2016-11-22: qty 2

## 2016-11-22 MED ORDER — EPHEDRINE 5 MG/ML INJ: 10.0000 mg | INTRAVENOUS | Status: DC | PRN

## 2016-11-22 MED ORDER — OXYCODONE-ACETAMINOPHEN 5-325 MG PO TABS: 1.0000 | ORAL_TABLET | ORAL | Status: DC | PRN

## 2016-11-22 MED ORDER — HYDRALAZINE HCL 20 MG/ML IJ SOLN: 10.0000 mg | Freq: Once | INTRAMUSCULAR | Status: DC | PRN

## 2016-11-22 MED ORDER — PHENYLEPHRINE 40 MCG/ML (10ML) SYRINGE FOR IV PUSH (FOR BLOOD PRESSURE SUPPORT): 80.0000 ug | PREFILLED_SYRINGE | INTRAVENOUS | Status: DC | PRN

## 2016-11-22 NOTE — Progress Notes (Signed)
?  leaking fluid.  Pt was not seen in office by provider. Reviewed BP readings and chart with provider. Advised to send pt to MAU for evaluation. [facility]  Call placed to MAU to notify of pt coming.

## 2016-11-22 NOTE — H&P (Signed)
LABOR AND DELIVERY ADMISSION HISTORY AND PHYSICAL NOTE  Jodi Mcdonald is a 25 y.o. female G3P1011 with IUP at 6590w3d by LMP presenting for from [facility] with elevated BPs. She had elevated Bp in office today and then in triage. Patient was admitted to L and D for delivery.   She reports positive fetal movement. She denies leakage of fluid or vaginal bleeding.  Prenatal History/Complications:  Past Medical History: Past Medical History:  Diagnosis Date  . Bartholin cyst   . H/O chlamydia infection 2009  . H/O cystitis   . H/O gonorrhea 2012   . History of being obese   . Infection    UTI  . Trichomonas 2009    Past Surgical History: Past Surgical History:  Procedure Laterality Date  . NO PAST SURGERIES    . WISDOM TOOTH EXTRACTION      Obstetrical History: OB History    Gravida Para Term Preterm AB Living   3 1 1  0 1 1   SAB TAB Ectopic Multiple Live Births   1 0 0 0 1      Social History: Social History   Social History  . Marital status: Single    Spouse name: N/A  . Number of children: N/A  . Years of education: N/A   Social History Main Topics  . Smoking status: Former Smoker    Packs/day: 0.25    Types: Cigarettes    Quit date: 02/13/2016  . Smokeless tobacco: Former NeurosurgeonUser     Comment: quit recently  . Alcohol use No     Comment: ocassionally  . Drug use: No  . Sexual activity: Yes   Other Topics Concern  . None   Social History Narrative  . None    Family History: Family History  Problem Relation Age of Onset  . Depression Mother   . Hypertension Mother   . Hypertension Father   . Diabetes Maternal Grandmother   . Diabetes Maternal Aunt   . Cancer Paternal Aunt   . Anesthesia problems Neg Hx     Allergies: No Known Allergies  Prescriptions Prior to Admission  Medication Sig Dispense Refill Last Dose  . acetaminophen (TYLENOL) 500 MG tablet Take 1,000 mg by mouth every 6 (six) hours as needed for moderate pain.   Not Taking  .  Pren-Fe-Meth-FA-Omeg w/o A (PRIMACARE) 30-1-470 MG CAPS Take 1 tablet by mouth daily. 30 capsule 12 Taking  . terconazole (TERAZOL 7) 0.4 % vaginal cream Place 1 applicator vaginally at bedtime. (Patient not taking: Reported on 11/03/2016) 45 g 0 Not Taking     Review of Systems   All systems reviewed and negative except as stated in HPI  Blood pressure (!) 147/89, pulse 81, temperature 98.4 F (36.9 C), temperature source Oral, resp. rate 18, height 5\' 5"  (1.651 m), weight 276 lb (125.2 kg), last menstrual period 02/13/2016, unknown if currently breastfeeding. General appearance: alert, cooperative and appears stated age Lungs: clear to auscultation bilaterally Heart: regular rate and rhythm Abdomen: soft, non-tender; bowel sounds normal Extremities: No calf swelling or tenderness Presentation: cephalic Fetal monitoring: Cat I tracing Uterine activity: irregular CTX Dilation: 1 Effacement (%): 60 Station: -3 Exam by:: Dr. Earlene PlaterWallace   Prenatal labs: ABO, Rh: O/Positive/-- (06/13 1506) Antibody: Negative (06/13 1506) Rubella: !Error! RPR: Non Reactive (09/05 1030)  HBsAg: Negative (06/13 1506)  HIV: Non Reactive (09/05 1030)  GBS:   neg 3hr : third trimester 88/103/67 Genetic screening:  normal Anatomy US: normal  Prenatal Transfer Tool  Maternal Diabetes: No Genetic Screening: Normal Maternal Ultrasounds/Referrals: Normal Fetal Ultrasounds or other Referrals:  None Maternal Substance Abuse:  No Significant Maternal Medications:  None Significant Maternal Lab Results: None  Results for orders placed or performed during the hospital encounter of 11/22/16 (from the past 24 hour(s))  Protein / creatinine ratio, urine   Collection Time: 11/22/16  2:29 PM  Result Value Ref Range   Creatinine, Urine 213.00 mg/dL   Total Protein, Urine PENDING mg/dL   Protein Creatinine Ratio PENDING 0.00 - 0.15 mg/mg[Cre]  CBC   Collection Time: 11/22/16  2:55 PM  Result Value Ref Range    WBC 9.6 4.0 - 10.5 K/uL   RBC 4.67 3.87 - 5.11 MIL/uL   Hemoglobin 12.8 12.0 - 15.0 g/dL   HCT 16.137.3 09.636.0 - 04.546.0 %   MCV 79.9 78.0 - 100.0 fL   MCH 27.4 26.0 - 34.0 pg   MCHC 34.3 30.0 - 36.0 g/dL   RDW 40.915.0 81.111.5 - 91.415.5 %   Platelets 299 150 - 400 K/uL    Patient Active Problem List   Diagnosis Date Noted  . Gestational HTN 11/22/2016  . Obesity in pregnancy with antepartum complication 09/19/2016  . Obesity (BMI 35.0-39.9 without comorbidity) 09/19/2016  . Encounter for supervision of pregnancy with insufficient prenatal care, antepartum 09/19/2016  . Supervision of other normal pregnancy, antepartum 06/08/2016    Assessment: Jodi Mcdonald is a 25 y.o. G3P1011 at 4850w3d here for IOL for pre-eclampsia w/o severe features  #Labor:Anticipate SVD. Cytotec for cervical ripening.Plan for pitocin following cytotec #Pain: IV Pain Meds prn/ Epidural on request #FWB: Cat I tracing #ID:  GBS negative #MOF: Breast  #MOC:Patch #Circ:  N/a # pre-eclampsia w/o severe features: she has not been on BP control will ordered labetolol protocol if pressures become severe.   WALLACE, NOAH I, DO PGY-3 11/22/2016, 3:14 PM   OB FELLOW HISTORY AND PHYSICAL ATTESTATION  I have seen and examined this patient; I agree with above documentation in the resident's note.    Jodi Mcdonald 11/22/2016, 3:42 PM

## 2016-11-22 NOTE — Anesthesia Pain Management Evaluation Note (Signed)
  CRNA Pain Management Visit Note  Patient: Jodi Mcdonald, 25 y.o., female  "Hello I am a member of the anesthesia team at [facility]. We have an anesthesia team available at all times to provide care throughout the hospital, including epidural management and anesthesia for C-section. I don't know your plan for the delivery whether it a natural birth, water birth, IV sedation, nitrous supplementation, doula or epidural, but we want to meet your pain goals."   1.Was your pain managed to your expectations on prior hospitalizations?   Yes   2.What is your expectation for pain management during this hospitalization?     Labor support without medications and IV pain meds  3.How can we help you reach that goal?   Record the patient's initial score and the patient's pain goal.   Pain: 2  Pain Goal: 8 [facility] wants you to be able to say your pain was always managed very well.  Jodi Mcdonald,Jodi Mcdonald 11/22/2016

## 2016-11-22 NOTE — MAU Note (Signed)
Sent from office, BP up today.  Has a headache- woke up with it today. Denies visual changes, epigastric pain or increased swelling.  No bleeding, reports watery d/c- first noted a few wks ago- was checked at md's then but not since

## 2016-11-23 ENCOUNTER — Inpatient Hospital Stay (HOSPITAL_COMMUNITY): Payer: 59 | Admitting: Anesthesiology

## 2016-11-23 ENCOUNTER — Encounter (HOSPITAL_COMMUNITY): Payer: Self-pay | Admitting: *Deleted

## 2016-11-23 DIAGNOSIS — Z3A40 40 weeks gestation of pregnancy: Secondary | ICD-10-CM

## 2016-11-23 DIAGNOSIS — O480 Post-term pregnancy: Secondary | ICD-10-CM

## 2016-11-23 DIAGNOSIS — O1414 Severe pre-eclampsia complicating childbirth: Secondary | ICD-10-CM

## 2016-11-23 LAB — CBC
HCT: 32.3 % — ABNORMAL LOW (ref 36.0–46.0)
HEMATOCRIT: 36.4 % (ref 36.0–46.0)
HEMOGLOBIN: 13.2 g/dL (ref 12.0–15.0)
Hemoglobin: 11 g/dL — ABNORMAL LOW (ref 12.0–15.0)
MCH: 28.5 pg (ref 26.0–34.0)
MCH: 27.3 pg (ref 26.0–34.0)
MCHC: 36.3 g/dL — AB (ref 30.0–36.0)
MCHC: 34.1 g/dL (ref 30.0–36.0)
MCV: 78.6 fL (ref 78.0–100.0)
MCV: 80.1 fL (ref 78.0–100.0)
PLATELETS: 263 10*3/uL (ref 150–400)
Platelets: 296 10*3/uL (ref 150–400)
RBC: 4.63 MIL/uL (ref 3.87–5.11)
RBC: 4.03 MIL/uL (ref 3.87–5.11)
RDW: 15.1 % (ref 11.5–15.5)
RDW: 15 % (ref 11.5–15.5)
WBC: 11.7 10*3/uL — ABNORMAL HIGH (ref 4.0–10.5)
WBC: 14.5 10*3/uL — ABNORMAL HIGH (ref 4.0–10.5)

## 2016-11-23 LAB — RPR: RPR Ser Ql: NONREACTIVE

## 2016-11-23 MED ORDER — SODIUM CHLORIDE 0.9% FLUSH
3.0000 mL | Freq: Two times a day (BID) | INTRAVENOUS | Status: DC
  Administered 2016-11-25: 3 mL via INTRAVENOUS

## 2016-11-23 MED ORDER — PRENATAL MULTIVITAMIN CH
1.0000 | ORAL_TABLET | Freq: Every day | ORAL | Status: DC
  Administered 2016-11-23 – 2016-11-26 (×4): 1 via ORAL
  Filled 2016-11-23 (×4): qty 1

## 2016-11-23 MED ORDER — METHYLERGONOVINE MALEATE 0.2 MG/ML IJ SOLN: 0.2000 mg | Freq: Once | INTRAMUSCULAR | Status: DC

## 2016-11-23 MED ORDER — ONDANSETRON HCL 4 MG/2ML IJ SOLN: 4.0000 mg | INTRAMUSCULAR | Status: DC | PRN

## 2016-11-23 MED ORDER — ACETAMINOPHEN 325 MG PO TABS: 650.0000 mg | ORAL_TABLET | ORAL | Status: DC | PRN

## 2016-11-23 MED ORDER — MAGNESIUM SULFATE 50 % IJ SOLN
2.0000 g/h | INTRAVENOUS | Status: AC
  Administered 2016-11-24: 2 g/h via INTRAVENOUS
  Filled 2016-11-23 (×2): qty 80

## 2016-11-23 MED ORDER — TERBUTALINE SULFATE 1 MG/ML IJ SOLN: 0.2500 mg | Freq: Once | INTRAMUSCULAR | Status: DC | PRN

## 2016-11-23 MED ORDER — HYDRALAZINE HCL 20 MG/ML IJ SOLN: 10.0000 mg | Freq: Once | INTRAMUSCULAR | Status: DC | PRN

## 2016-11-23 MED ORDER — COCONUT OIL OIL
1.0000 "application " | TOPICAL_OIL | Status: DC | PRN
  Filled 2016-11-23: qty 120

## 2016-11-23 MED ORDER — SODIUM CHLORIDE 0.9% FLUSH
3.0000 mL | INTRAVENOUS | Status: DC | PRN
  Administered 2016-11-25: 3 mL via INTRAVENOUS
  Filled 2016-11-23: qty 3

## 2016-11-23 MED ORDER — MISOPROSTOL 200 MCG PO TABS
800.0000 ug | ORAL_TABLET | Freq: Once | ORAL | Status: AC
  Administered 2016-11-23: 800 ug via RECTAL

## 2016-11-23 MED ORDER — MAGNESIUM SULFATE BOLUS VIA INFUSION
4.0000 g | Freq: Once | INTRAVENOUS | Status: DC
  Filled 2016-11-23: qty 500

## 2016-11-23 MED ORDER — FENTANYL 2.5 MCG/ML BUPIVACAINE 1/10 % EPIDURAL INFUSION (WH - ANES): 14.0000 mL/h | INTRAMUSCULAR | Status: DC | PRN

## 2016-11-23 MED ORDER — SODIUM CHLORIDE 0.9 % IV SOLN: 250.0000 mL | INTRAVENOUS | Status: DC | PRN

## 2016-11-23 MED ORDER — SODIUM BICARBONATE 8.4 % IV SOLN
INTRAVENOUS | Status: DC | PRN
  Administered 2016-11-23: 5 mL via EPIDURAL

## 2016-11-23 MED ORDER — WITCH HAZEL-GLYCERIN EX PADS: 1.0000 "application " | MEDICATED_PAD | CUTANEOUS | Status: DC | PRN

## 2016-11-23 MED ORDER — MAGNESIUM SULFATE BOLUS VIA INFUSION
4.0000 g | Freq: Once | INTRAVENOUS | Status: AC
Start: 2016-11-23 — End: 2016-11-23
  Administered 2016-11-23: 4 g via INTRAVENOUS
  Filled 2016-11-23: qty 500

## 2016-11-23 MED ORDER — LIDOCAINE HCL (PF) 1 % IJ SOLN
INTRAMUSCULAR | Status: DC | PRN
  Administered 2016-11-23 (×2): 4 mL
  Administered 2016-11-23: 5 mL

## 2016-11-23 MED ORDER — SIMETHICONE 80 MG PO CHEW: 80.0000 mg | CHEWABLE_TABLET | ORAL | Status: DC | PRN

## 2016-11-23 MED ORDER — MORPHINE SULFATE 15 MG PO TABS: 15.0000 mg | ORAL_TABLET | ORAL | Status: DC | PRN

## 2016-11-23 MED ORDER — BENZOCAINE-MENTHOL 20-0.5 % EX AERO
1.0000 "application " | INHALATION_SPRAY | CUTANEOUS | Status: DC | PRN
  Filled 2016-11-23: qty 56

## 2016-11-23 MED ORDER — HYDRALAZINE HCL 20 MG/ML IJ SOLN: 5.0000 mg | INTRAMUSCULAR | Status: DC | PRN

## 2016-11-23 MED ORDER — LACTATED RINGERS IV SOLN
2.0000 g/h | INTRAVENOUS | Status: DC
  Filled 2016-11-23: qty 80

## 2016-11-23 MED ORDER — OXYTOCIN 40 UNITS IN LACTATED RINGERS INFUSION - SIMPLE MED: 10.0000 [IU]/h | INTRAVENOUS | Status: DC

## 2016-11-23 MED ORDER — OXYTOCIN 40 UNITS IN LACTATED RINGERS INFUSION - SIMPLE MED: 2.5000 [IU]/h | INTRAVENOUS | Status: DC | PRN

## 2016-11-23 MED ORDER — IBUPROFEN 600 MG PO TABS
600.0000 mg | ORAL_TABLET | Freq: Four times a day (QID) | ORAL | Status: DC
  Administered 2016-11-23 – 2016-11-26 (×13): 600 mg via ORAL
  Filled 2016-11-23 (×13): qty 1

## 2016-11-23 MED ORDER — TETANUS-DIPHTH-ACELL PERTUSSIS 5-2.5-18.5 LF-MCG/0.5 IM SUSP: 0.5000 mL | Freq: Once | INTRAMUSCULAR | Status: DC

## 2016-11-23 MED ORDER — SENNOSIDES-DOCUSATE SODIUM 8.6-50 MG PO TABS
2.0000 | ORAL_TABLET | ORAL | Status: DC
  Administered 2016-11-24 – 2016-11-25 (×2): 2 via ORAL
  Filled 2016-11-23 (×3): qty 2

## 2016-11-23 MED ORDER — LABETALOL HCL 5 MG/ML IV SOLN: 20.0000 mg | INTRAVENOUS | Status: DC | PRN

## 2016-11-23 MED ORDER — ZOLPIDEM TARTRATE 5 MG PO TABS: 5.0000 mg | ORAL_TABLET | Freq: Every evening | ORAL | Status: DC | PRN

## 2016-11-23 MED ORDER — DIPHENHYDRAMINE HCL 25 MG PO CAPS: 25.0000 mg | ORAL_CAPSULE | Freq: Four times a day (QID) | ORAL | Status: DC | PRN

## 2016-11-23 MED ORDER — OXYTOCIN 40 UNITS IN LACTATED RINGERS INFUSION - SIMPLE MED
1.0000 m[IU]/min | INTRAVENOUS | Status: DC
  Administered 2016-11-23: 2 m[IU]/min via INTRAVENOUS

## 2016-11-23 MED ORDER — ONDANSETRON HCL 4 MG PO TABS: 4.0000 mg | ORAL_TABLET | ORAL | Status: DC | PRN

## 2016-11-23 MED ORDER — DIBUCAINE 1 % RE OINT
1.0000 | TOPICAL_OINTMENT | RECTAL | Status: DC | PRN
Start: 2016-11-23 — End: 2016-11-26
  Filled 2016-11-23: qty 28

## 2016-11-23 MED ORDER — MISOPROSTOL 200 MCG PO TABS
ORAL_TABLET | ORAL | Status: AC
  Filled 2016-11-23: qty 4

## 2016-11-23 MED ORDER — OXYTOCIN 40 UNITS IN LACTATED RINGERS INFUSION - SIMPLE MED
INTRAVENOUS | Status: AC
  Filled 2016-11-23: qty 1000

## 2016-11-23 NOTE — Progress Notes (Signed)
I was notified by RN that patient had passed large clot from vagina and that several pads were copiously saturated with dark blood. Patients vitals were stable on arrival. Pt was in no acute distress. I manually evacuated several remaining small clots and did not appreciate any active bleeding from the vaginal canal or cervix on manual exam. I did proceed to administer 800mcg of cytotec rectally.We will continue to monitor patient's post partum course.  Sheria LangNoah Wallace,DO PGY-3  Midwife attestation Post Partum Day 0 I have seen and examined this patient and agree with above documentation in the resident's note.  Pt found to have increased lochia and clots by RN. Recent void of 800 mL. Recent BF session.   PE:  BP 135/85 (BP Location: Left Arm)   Pulse 89   Temp 98.7 F (37.1 C) (Oral)   Resp 18   Ht 5\' 5"  (1.651 m)   Wt 125.2 kg (276 lb)   LMP 02/13/2016 (Exact Date)   SpO2 99%   Breastfeeding? Unknown   BMI 45.93 kg/m  Gen: well appearing Heart: reg rate Lungs: nml effort and rate Fundus firm  A/P: PPH: uterine atony likely 2/2 Magnesium Sulfate infusion Pitocin infusion Cytotec CBC  Will continue to monitor  Donette LarryMelanie Hesham Womac, CNM 2:15 PM

## 2016-11-23 NOTE — Anesthesia Preprocedure Evaluation (Signed)
Anesthesia Evaluation  Patient identified by MRN, date of birth, ID band Patient awake and Patient unresponsive    Reviewed: Allergy & Precautions, H&P , Patient's Chart, lab work & pertinent test results  Airway Mallampati: III  TM Distance: >3 FB Neck ROM: full    Dental no notable dental hx.    Pulmonary neg pulmonary ROS, former smoker,    Pulmonary exam normal        Cardiovascular hypertension, Normal cardiovascular exam Rhythm:Regular     Neuro/Psych negative neurological ROS  negative psych ROS   GI/Hepatic negative GI ROS, Neg liver ROS,   Endo/Other  Morbid obesity  Renal/GU negative Renal ROS     Musculoskeletal negative musculoskeletal ROS (+)   Abdominal (+) + obese,   Peds  Hematology negative hematology ROS (+)   Anesthesia Other Findings   Reproductive/Obstetrics (+) Pregnancy                             Anesthesia Physical  Anesthesia Plan  ASA: III  Anesthesia Plan: Epidural   Post-op Pain Management:    Induction:   Airway Management Planned:   Additional Equipment:   Intra-op Plan:   Post-operative Plan:   Informed Consent: I have reviewed the patients History and Physical, chart, labs and discussed the procedure including the risks, benefits and alternatives for the proposed anesthesia with the patient or authorized representative who has indicated his/her understanding and acceptance.     Plan Discussed with:   Anesthesia Plan Comments:         Anesthesia Quick Evaluation

## 2016-11-23 NOTE — Anesthesia Procedure Notes (Signed)
Epidural Patient location during procedure: OB  Staffing Anesthesiologist: Estie Sproule Performed: anesthesiologist   Preanesthetic Checklist Completed: patient identified, pre-op evaluation, timeout performed, IV checked, risks and benefits discussed and monitors and equipment checked  Epidural Patient position: sitting Prep: site prepped and draped and DuraPrep Patient monitoring: heart rate Approach: midline Location: L3-L4 Injection technique: LOR air and LOR saline  Needle:  Needle type: Tuohy  Needle gauge: 17 G Needle length: 9 cm Needle insertion depth: 7 cm Catheter type: closed end flexible Catheter size: 19 Gauge Catheter at skin depth: 13 cm Test dose: negative  Assessment Sensory level: T8 Events: blood not aspirated, injection not painful, no injection resistance, negative IV test and no paresthesia  Additional Notes Reason for block:procedure for pain     

## 2016-11-23 NOTE — Anesthesia Postprocedure Evaluation (Signed)
Anesthesia Post Note  Patient: Architectortia Caracci  Procedure(s) Performed: * No procedures listed *  Patient location during evaluation: Mother Baby Anesthesia Type: Epidural Level of consciousness: awake and alert and oriented Pain management: satisfactory to patient Vital Signs Assessment: post-procedure vital signs reviewed and stable Respiratory status: spontaneous breathing and nonlabored ventilation Cardiovascular status: stable Postop Assessment: no headache, no backache, no signs of nausea or vomiting, adequate PO intake and patient able to bend at knees (patient up walking) Anesthetic complications: no     Last Vitals:  Vitals:   11/23/16 1259 11/23/16 1308  BP: 123/81 122/77  Pulse: 83 80  Resp: 18 18  Temp:  37.1 C    Last Pain:  Vitals:   11/23/16 1057  TempSrc:   PainSc: 2    Pain Goal: Patients Stated Pain Goal: 2 (11/23/16 1057)               Lucifer Soja

## 2016-11-23 NOTE — Plan of Care (Signed)
Problem: Physical Regulation: Goal: Ability to maintain clinical measurements within normal limits will improve Outcome: Progressing  Repeat lab works in AM Goal: Will remain free from infection Outcome: Progressing No signs and symptoms of infection.  Problem: Activity: Goal: Risk for activity intolerance will decrease Outcome: Completed/Met Date Met: 11/23/16 Out of bed to bathroom independently, tolerating well.  Problem: Fluid Volume: Goal: Ability to maintain a balanced intake and output will improve Outcome: Progressing Adequate urine output with good food intake.  Problem: Bowel/Gastric: Goal: Will not experience complications related to bowel motility Outcome: Completed/Met Date Met: 11/23/16 Positive bowel sounds and patient claims, had a bowel movement .  Problem: Education: Goal: Knowledge of condition will improve Outcome: Progressing Patient verbalized awareness of condition improving.  Problem: Coping: Goal: Ability to cope will improve Outcome: Progressing Coping with having a newborn, " I will be able to care for baby better once this magnesium is out." Goal: Ability to identify and utilize available resources and services will improve Outcome: Completed/Met Date Met: 11/23/16 Discussed resources that she can utilize to benefit her and baby.  Problem: Life Cycle: Goal: Risk for postpartum hemorrhage will decrease Outcome: Progressing Passed one quarter size clot.  Uterus firm and contracted. Scant bleeding. Goal: Chance of risk for complications during the postpartum period will decrease Outcome: Progressing To continue observation on bleeding and pre-eclampsia symptoms.  Problem: Role Relationship: Goal: Ability to demonstrate positive interaction with newborn will improve Outcome: Progressing Cares and interact with newborn .  Problem: Urinary Elimination: Goal: Ability to reestablish a normal urinary elimination pattern will improve Outcome:  Completed/Met Date Met: 11/23/16 Voiding without any difficulty.

## 2016-11-23 NOTE — Lactation Note (Signed)
This note was copied from a baby's chart. Lactation Consultation Note  Patient Name: Jodi Mcdonald VFIEP'PToday's Date: 11/23/2016 Reason for consult: Initial assessment Breastfeeding consultation services and support information given to mom.  She states baby latched and nursed well about one hour ago.  Baby is currently sleeping.  Instructed on waking cues and to feed with any cue. Mom called me shortly after first visit and baby awake and cueing.  Assisted with positioning baby in cross cradle hold.  Baby latched easily and nursed actively.  Taught mom waking techniques and breast massage.  Encouraged to call with questions/assist.  Maternal Data Has patient been taught Hand Expression?: Yes Does the patient have breastfeeding experience prior to this delivery?: Yes  Feeding    LATCH Score/Interventions                      Lactation Tools Discussed/Used     Consult Status Consult Status: Follow-up Date: 11/24/16 Follow-up type: In-patient    Huston FoleyMOULDEN, Arnell Slivinski S 11/23/2016, 1:31 PM

## 2016-11-23 NOTE — Progress Notes (Signed)
Patient called out and stated " I feel something coming out."  This RN found uterus to be boggy and lochia moderate to heavy.  Continued to message uterus and patient expelled grapefruit size clot.  Continued to message uterus and MD notified.  Patient tolerated procedure well.

## 2016-11-23 NOTE — Progress Notes (Signed)
Received call on rapid response phone at 1243 [facility] to come to room 305 for possible PPH.  Notified House Supervisor, Larita FifeLynn, RN on way to case.  Rosalia HammersJessica Wall, RN accompanied RRRN.   Upon arrival to room 305, Dr. Deforest HoylesNoah Wallace at bedside with Herby AbrahamKimberly Caudle, RN and Dossie Arbourhristine Galloway, RN.  Nurse Caudle had already initiated [facility] Protocol.  1259 Cytotec 800 mg per rectum ordered and administered - MD Earlene PlaterWallace administered. Total approximate blood loss per weights and measures was to be 735 mL.   Patient stable, family at bedside.  Two RN's remained at bedside. KC, RN and CG, RN.

## 2016-11-24 LAB — CBC
HEMATOCRIT: 28 % — AB (ref 36.0–46.0)
Hemoglobin: 9.5 g/dL — ABNORMAL LOW (ref 12.0–15.0)
MCH: 27.2 pg (ref 26.0–34.0)
MCHC: 33.9 g/dL (ref 30.0–36.0)
MCV: 80.2 fL (ref 78.0–100.0)
Platelets: 237 10*3/uL (ref 150–400)
RBC: 3.49 MIL/uL — ABNORMAL LOW (ref 3.87–5.11)
RDW: 15.4 % (ref 11.5–15.5)
WBC: 10.2 10*3/uL (ref 4.0–10.5)

## 2016-11-24 MED ORDER — DOCUSATE SODIUM 100 MG PO CAPS: 100.0000 mg | ORAL_CAPSULE | Freq: Two times a day (BID) | ORAL | Status: DC | PRN

## 2016-11-24 MED ORDER — FERROUS SULFATE 325 (65 FE) MG PO TABS
325.0000 mg | ORAL_TABLET | Freq: Two times a day (BID) | ORAL | Status: DC
  Administered 2016-11-24 – 2016-11-26 (×5): 325 mg via ORAL
  Filled 2016-11-24 (×5): qty 1

## 2016-11-24 NOTE — Progress Notes (Signed)
Post Partum Day 1 s/p IOL for severe preeclampsia at 385w4d  Subjective: No complaints, voiding and tolerating PO. Denies headache, visual symptoms, RUQ/epigastric. Baby is doing well in room with patient. Breastfeeding.   Objective: Blood pressure 134/86, pulse 88, temperature 98.8 F (37.1 C), temperature source Oral, resp. rate 18, height 5\' 5"  (1.651 m), weight 276 lb (125.2 kg), last menstrual period 02/13/2016, SpO2 99 %, unknown if currently breastfeeding.  Physical Exam:  General: alert and no distress  Heart: RRR Lungs: CTAB Lochia: appropriate Uterine Fundus: firm Extremities: No evidence of DVT seen on physical exam. Negative Homan's sign. No cords or calf tenderness. 1+ DTRs, no clonus   Recent Labs  11/23/16 1237 11/24/16 0608  HGB 11.0* 9.5*  HCT 32.3* 28.0*    Assessment/Plan: Stable BP, no meds needed for now Continue magnesium sulfate x 24 hour duration Lactation consultation Desires patch for contraception. Routine postpartum care   LOS: 2 days   Jodi Mcdonald A, MD 11/24/2016, 7:05 AM

## 2016-11-24 NOTE — Lactation Note (Signed)
This note was copied from a baby's chart. Lactation Consultation Note  Patient Name: Jodi Mcdonald ZOXWR'UToday's Date: 11/24/2016 Reason for consult: Follow-up assessment;NICU baby  Baby 31 hours old. Mom reports that the baby has been nursing well--latching deeply, sustaining the latch and mom is hearing swallows. Mom is able to hand express colostrum prior to baby latching. Assisted mom to latch baby to left breast in football position--with bili light still at baby's back--and baby latched deeply and suckled rhythmically with intermittent swallows noted. Enc mom to stimulate baby to continue to suckle, and discussed how hyperbilirubinemia and bili lights can contribute to baby's sleepiness at the breast. Enc mom to hold baby STS if baby has not nursed in 3 hours, and to nurse with cues.    Maternal Data Does the patient have breastfeeding experience prior to this delivery?: No  Feeding Feeding Type: Breast Fed  LATCH Score/Interventions Latch: Grasps breast easily, tongue down, lips flanged, rhythmical sucking.  Audible Swallowing: Spontaneous and intermittent  Type of Nipple: Everted at rest and after stimulation  Comfort (Breast/Nipple): Soft / non-tender     Hold (Positioning): Assistance needed to correctly position infant at breast and maintain latch. Intervention(s): Position options  LATCH Score: 9  Lactation Tools Discussed/Used     Consult Status Consult Status: Follow-up Date: 11/25/16 Follow-up type: In-patient    Sherlyn HayJennifer D Tyan Lasure 11/24/2016, 2:23 PM

## 2016-11-25 MED ORDER — AMLODIPINE BESYLATE 5 MG PO TABS
5.0000 mg | ORAL_TABLET | Freq: Every day | ORAL | Status: DC
  Administered 2016-11-25 – 2016-11-26 (×2): 5 mg via ORAL
  Filled 2016-11-25 (×2): qty 1

## 2016-11-25 MED ORDER — AMLODIPINE BESYLATE 10 MG PO TABS: 10.0000 mg | ORAL_TABLET | Freq: Every day | ORAL | Status: DC

## 2016-11-25 NOTE — Lactation Note (Signed)
This note was copied from a baby's chart. Lactation Consultation Note  Patient Name: Jodi Mcdonald ZOXWR'UToday's Date: 11/25/2016 Reason for consult: Follow-up assessment;Hyperbilirubinemia  Baby 54 hours old. Mom reports that baby at the breast for long periods of time and then still fussy after nursing. Mom states that baby will not latch to right breast, but keeps pushing right nipple out of her mouth. Assisted mom to latch baby to right breast, but baby frantic at the breast and not willing to latch. Attempted to hand express, but only a few drops flowing. Mom agreeable to pumping. Set mom up with pump and reviewed assembly, use, disassembly and cleaning. Mom able to pump 4 ml of EBM which was given to the baby at the breast with 5French feeding tube and syringe. Demonstrated to mom how to use finger and same supplemental system to feed baby, and baby given 6 ml of Alimentum and tolerated well. Enc mom to continue to increase amount offered in 5 ml increments until following guideline amounts, then watch for baby to be satisfied. Mom was concerned because her first child would not go back to the breast after given a bottle, so enc mom to keep supplementing with tube system.  Plan is for mom to put baby to breast first with cues, then supplement with EBM/formula according to guidelines. Enc mom to post-pump after each feeding using DEBP. Mom requested a hand pump as well. Reviewed EBM storage guidelines.  Discussed assessment and interventions with patient's bedside RN, Eunice BlaseDebbie.    Maternal Data    Feeding Feeding Type: Breast Fed Length of feed: 0 min  LATCH Score/Interventions Latch: Too sleepy or reluctant, no latch achieved, no sucking elicited. Intervention(s): Skin to skin Intervention(s): Adjust position;Assist with latch;Breast compression  Audible Swallowing: None Intervention(s): Skin to skin Intervention(s): Skin to skin;Hand expression  Type of Nipple: Everted at rest and  after stimulation  Comfort (Breast/Nipple): Soft / non-tender     Hold (Positioning): Assistance needed to correctly position infant at breast and maintain latch. Intervention(s): Position options  LATCH Score: 5  Lactation Tools Discussed/Used WIC Program: No Pump Review: Setup, frequency, and cleaning;Milk Storage Initiated by:: JW Date initiated:: 11/25/16   Consult Status Consult Status: Follow-up Date: 11/26/16 Follow-up type: In-patient    Sherlyn HayJennifer D Byard Carranza 11/25/2016, 12:39 PM

## 2016-11-25 NOTE — Progress Notes (Addendum)
Post Partum Day #2 Subjective: up ad lib, voiding, tolerating PO and breast feeding is going well.  Reports occasional HA.   Objective: Blood pressure 133/83, pulse 95, temperature 98.3 F (36.8 C), temperature source Oral, resp. rate 18, height 5\' 5"  (1.651 m), weight 276 lb (125.2 kg), last menstrual period 02/13/2016, SpO2 99 %, unknown if currently breastfeeding.  Physical Exam:  General: alert, cooperative and no distress Lochia: appropriate Uterine Fundus: firm Incision: none DVT Evaluation: No evidence of DVT seen on physical exam. No cords or calf tenderness. No significant calf/ankle edema.   Recent Labs  11/23/16 1237 11/24/16 0608  HGB 11.0* 9.5*  HCT 32.3* 28.0*    Assessment/Plan: Plan for discharge tomorrow, Breastfeeding and Contraception planning transdermal patch, infant on phototherapy.  Norvasc 5mg  added due to elevated blood pressures overnight.   Hx of Postpartum hemorrhage: 738.  Hgb: 9.5, asymptomatic.  Acute blood loss anemia--begin iron   LOS: 3 days   Roe CoombsRachelle A Denney, CNM 11/25/2016, 8:11 AM

## 2016-11-26 MED ORDER — IBUPROFEN 600 MG PO TABS: 600.0000 mg | ORAL_TABLET | Freq: Four times a day (QID) | ORAL | 0 refills | Status: DC

## 2016-11-26 MED ORDER — AMLODIPINE BESYLATE 5 MG PO TABS: 5.0000 mg | ORAL_TABLET | Freq: Every day | ORAL | 1 refills | Status: DC

## 2016-11-26 NOTE — Lactation Note (Addendum)
This note was copied from a baby's chart. Lactation Consultation Note  Patient Name: Jodi Mcdonald ZOXWR'UToday's Date: 11/26/2016 Reason for consult: Follow-up assessment   With this mom and term baby, now 6877 hours old, and 41 weeks CGA. The baby was over 10% wt loss a couple of days ago, last evening, weight up and loss down to 8.6%. Mom began supplementing the baby with EBm and formula. Mom is now able to pump 30 ml's of milk, so will not need as much formula once home. Baby will be rewieghed at 12 noon, and wt continues to increase, mom and baby will be discharged to home. I gave mom a second hand pump, and advised her to offer EBm as supplement after breast, and then pump to comfort with hand pump after feedings. Milk storage reviewed with mom, and mom to call lactation for questions/conerns.    Maternal Data    Feeding Feeding Type: Breast Milk Nipple Type: Slow - flow Length of feed: 10 min  LATCH Score/Interventions Latch: Grasps breast easily, tongue down, lips flanged, rhythmical sucking. Intervention(s): Skin to skin;Teach feeding cues;Waking techniques  Audible Swallowing: Spontaneous and intermittent  Type of Nipple: Everted at rest and after stimulation  Comfort (Breast/Nipple): Soft / non-tender     Hold (Positioning): Assistance needed to correctly position infant at breast and maintain latch. Intervention(s): Breastfeeding basics reviewed;Support Pillows;Position options;Skin to skin  LATCH Score: 9  Lactation Tools Discussed/Used WIC Program:  [facility]   Consult Status Consult Status: Complete Follow-up type: Call as needed    Alfred LevinsLee, Lindi Abram Anne 11/26/2016, 12:05 PM

## 2016-11-26 NOTE — Discharge Summary (Signed)
OB Discharge Summary     Patient Name: Jodi Mcdonald DOB: 06-30-1991 MRN: [id number]  Date of admission: 11/22/2016 Delivering MD: Michaele OfferMUMAW, ELIZABETH WOODLAND   Date of discharge: 11/26/2016  Admitting diagnosis: 40WKS, HIGH BP Intrauterine pregnancy: 1548w4d     Secondary diagnosis:  Active Problems:   Gestational HTN  Additional problems: none     Discharge diagnosis: Term Pregnancy Delivered                                                                                                Post partum procedures:   Augmentation: Cytotec  Complications: None  Hospital course:  Induction of Labor With Vaginal Delivery   25 y.o. yo [id number] at 3648w4d was admitted to the hospital 11/22/2016 for induction of labor.  Indication for induction: Gestational hypertension and Preeclampsia.  Patient had an uncomplicated labor course as follows: Membrane Rupture Time/Date: 10:00 PM ,11/22/2016   Intrapartum Procedures: Episiotomy: None [1]                                         Lacerations:  None [1]  Patient had delivery of a Viable infant.  Information for the patient's newborn:  Jackquline BoschSeegars, Girl Daniel Nonesortia [[id number]][[id number]]  Delivery Method: Vag-Spont   11/23/2016  Details of delivery can be found in separate delivery note.  Patient had a routine postpartum course. Patient is discharged home 11/26/16.   Physical exam Vitals:   11/25/16 1500 11/25/16 2000 11/25/16 2343 11/26/16 0347  BP: (!) 148/97 135/85 (!) 144/83 136/85  Pulse: 83 81 91 87  Resp: 18 18 16 18   Temp: 99.2 F (37.3 C) 99.3 F (37.4 C) 98.7 F (37.1 C) 99.1 F (37.3 C)  TempSrc: Oral Oral Oral Oral  SpO2: 98% 100% 99% 100%  Weight:      Height:       General: alert, cooperative and no distress Lochia: appropriate Uterine Fundus: firm Incision: N/A DVT Evaluation: No evidence of DVT seen on physical exam. Labs: Lab Results  Component Value Date   WBC 10.2 11/24/2016   HGB 9.5 (L) 11/24/2016   HCT 28.0 (L) 11/24/2016    MCV 80.2 11/24/2016   PLT 237 11/24/2016   CMP Latest Ref Rng & Units 11/22/2016  Glucose 65 - 99 mg/dL 696(E105(H)  BUN 6 - 20 mg/dL 8  Creatinine 9.520.44 - 8.411.00 mg/dL 3.240.66  Sodium 401135 - 027145 mmol/L 133(L)  Potassium 3.5 - 5.1 mmol/L 3.9  Chloride 101 - 111 mmol/L 105  CO2 22 - 32 mmol/L 21(L)  Calcium 8.9 - 10.3 mg/dL 8.9  Total Protein 6.5 - 8.1 g/dL 6.1(L)  Total Bilirubin 0.3 - 1.2 mg/dL 0.4  Alkaline Phos 38 - 126 U/L 111  AST 15 - 41 U/L 15  ALT 14 - 54 U/L 11(L)    Discharge instruction: per After Visit Summary and "Baby and Me Booklet".  After visit meds:    Medication List    STOP taking these medications   acetaminophen 500 MG tablet  Commonly known as:  TYLENOL     TAKE these medications   amLODipine 5 MG tablet Commonly known as:  NORVASC Take 1 tablet (5 mg total) by mouth daily.   ibuprofen 600 MG tablet Commonly known as:  ADVIL,MOTRIN Take 1 tablet (600 mg total) by mouth every 6 (six) hours.   prenatal multivitamin Tabs tablet Take 1 tablet by mouth daily.       Diet: routine diet  Activity: Advance as tolerated. Pelvic rest for 6 weeks.   Outpatient follow up:6 weeks Follow up Appt:No future appointments. Follow up Visit:No Follow-up on file.  Postpartum contraception: Not Discussed  Newborn Data: Live born female  Birth Weight: 6 lb 13.4 oz (3100 g) APGAR: 8, 9  Baby Feeding: Breast Disposition:rooming in   11/26/2016 Scheryl DarterARNOLD,JAMES, MD

## 2016-11-26 NOTE — Progress Notes (Signed)
Discharge instructions reviewed with patient.  Patient states understanding of home care for self and baby, medications, activity, signs/symptoms to report to MD and return MD office visits for both.  Patients significant other and family will assist with her care @ home.  No home  equipment needed, patient has prescriptions and all personal belongings.  Patient ambulated for discharge in stable condition with staff without incident.  Baby discharged home with mother.  

## 2016-11-26 NOTE — Discharge Instructions (Signed)

## 2016-12-02 DIAGNOSIS — D62 Acute posthemorrhagic anemia: Secondary | ICD-10-CM | POA: Diagnosis not present

## 2017-01-02 ENCOUNTER — Other Ambulatory Visit: Payer: Self-pay | Admitting: Obstetrics & Gynecology

## 2017-01-04 ENCOUNTER — Encounter: Payer: Self-pay | Admitting: *Deleted

## 2017-01-04 ENCOUNTER — Ambulatory Visit (INDEPENDENT_AMBULATORY_CARE_PROVIDER_SITE_OTHER): Payer: 59 | Admitting: Obstetrics

## 2017-01-04 ENCOUNTER — Other Ambulatory Visit (HOSPITAL_COMMUNITY)
Admission: RE | Admit: 2017-01-04 | Discharge: 2017-01-04 | Disposition: A | Discharge status: Home / Self Care | Payer: 59 | Source: Ambulatory Visit | Attending: Obstetrics | Admitting: Obstetrics

## 2017-01-04 ENCOUNTER — Encounter: Payer: Self-pay | Admitting: Obstetrics

## 2017-01-04 DIAGNOSIS — Z113 Encounter for screening for infections with a predominantly sexual mode of transmission: Secondary | ICD-10-CM | POA: Insufficient documentation

## 2017-01-04 DIAGNOSIS — Z30011 Encounter for initial prescription of contraceptive pills: Secondary | ICD-10-CM

## 2017-01-04 DIAGNOSIS — Z3009 Encounter for other general counseling and advice on contraception: Secondary | ICD-10-CM

## 2017-01-04 DIAGNOSIS — Z202 Contact with and (suspected) exposure to infections with a predominantly sexual mode of transmission: Secondary | ICD-10-CM

## 2017-01-04 DIAGNOSIS — O163 Unspecified maternal hypertension, third trimester: Secondary | ICD-10-CM

## 2017-01-04 MED ORDER — NORETHINDRONE 0.35 MG PO TABS: 1.0000 | ORAL_TABLET | Freq: Every day | ORAL | 11 refills | Status: DC

## 2017-01-04 NOTE — Progress Notes (Deleted)
Post Partum Exam  Jodi Mcdonald is a 26 y.o. [phone] female who presents for a postpartum visit. She is 6 weeks postpartum following a spontaneous vaginal delivery. I have fully reviewed the prenatal and intrapartum course. The delivery was at 6240w4d gestational weeks.  Anesthesia: epidural. Postpartum course has been unremarkable. Baby's course has been unremarkable. Baby is feeding by breast. Bleeding no bleeding. Bowel function is normal. Bladder function is normal. Patient is sexually active. Contraception method is none. Postpartum depression screening:neg   {Common ambulatory SmartLinks:19316}  Review of Systems {ros; complete:30496}    Objective:    BP 116/78 mmHg  Pulse 78  Resp 16  Ht 5\' 5"  (1.651 m)  Wt 211 lb (95.709 kg)  BMI 35.11 kg/m2  Breastfeeding? Yes  General:  {gen appearance:16600}   Breasts:  {breast exam:1202::"inspection negative, no nipple discharge or bleeding, no masses or nodularity palpable"}  Lungs: {lung exam:16931}  Heart:  {heart exam:5510}  Abdomen: {abdomen exam:16834}   Vulva:  {labia exam:12198}  Vagina: {vagina exam:12200}  Cervix:  {cervix exam:14595}  Corpus: {uterus exam:12215}  Adnexa:  {adnexa exam:12223}  Rectal Exam: {rectal/vaginal exam:12274}        Assessment:    *** postpartum exam. Pap smear {done:10129} at today's visit.   Plan:   1. Contraception: {method:5051} 2. *** 3. Follow up in: {1-10:13787} {time; units:19136} or as needed.

## 2017-01-04 NOTE — Progress Notes (Signed)
Subjective:     Jodi Mcdonald is a 26 y.o. female who presents for a postpartum visit. She is 6 weeks postpartum following a spontaneous vaginal delivery. I have fully reviewed the prenatal and intrapartum course. The delivery was at 41 gestational weeks. Outcome: spontaneous vaginal delivery. Anesthesia: epidural. Postpartum course has been complicated by HTN. Baby's course has been normal. Baby is feeding by breast. Bleeding no bleeding. Bowel function is normal. Bladder function is normal. Patient is sexually active. Contraception method is none. Postpartum depression screening: negative.  Tobacco, alcohol and substance abuse history reviewed.  Adult immunizations reviewed including TDAP, rubella and varicella.  The following portions of the patient's history were reviewed and updated as appropriate: allergies, current medications, past family history, past medical history, past social history, past surgical history and problem list.  Review of Systems A comprehensive review of systems was negative.   Objective:    BP 115/82   Pulse 76   Ht 5\' 4"  (1.626 m)   Wt 238 lb (108 kg)   LMP 12/04/2016   Breastfeeding? Yes   BMI 40.85 kg/m   General:  alert and no distress   Breasts:  inspection negative, no nipple discharge or bleeding, no masses or nodularity palpable  Lungs: clear to auscultation bilaterally  Heart:  regular rate and rhythm, S1, S2 normal, no murmur, click, rub or gallop  Abdomen: soft, non-tender; bowel sounds normal; no masses,  no organomegaly   Vulva:  normal  Vagina: normal vagina  Cervix:  no cervical motion tenderness  Corpus: normal size, contour, position, consistency, mobility, non-tender  Adnexa:  no mass, fullness, tenderness  Rectal Exam: Not performed.          50% of 15 min visit spent on counseling and coordination of care.    Assessment:     Normal postpartum exam. Pap smear not done at today's visit.     Contraceptive Counseling and  Advice  Plan:    1. Contraception: oral progesterone-only contraceptive 2. Micronor Rx 3. Follow up in: 8 months or as needed.    Healthy lifestyle practices reviewed

## 2017-01-05 LAB — URINALYSIS, ROUTINE W REFLEX MICROSCOPIC
BILIRUBIN UA: NEGATIVE
Glucose, UA: NEGATIVE
KETONES UA: NEGATIVE
LEUKOCYTES UA: NEGATIVE
Nitrite, UA: NEGATIVE
PROTEIN UA: NEGATIVE
RBC UA: NEGATIVE
SPEC GRAV UA: 1.029 (ref 1.005–1.030)
Urobilinogen, Ur: 0.2 mg/dL (ref 0.2–1.0)
pH, UA: 5.5 (ref 5.0–7.5)

## 2017-01-05 LAB — CERVICOVAGINAL ANCILLARY ONLY
Chlamydia: NEGATIVE
Neisseria Gonorrhea: NEGATIVE
Trichomonas: NEGATIVE

## 2017-06-12 ENCOUNTER — Ambulatory Visit: Payer: 59 | Admitting: Obstetrics

## 2017-06-13 ENCOUNTER — Ambulatory Visit: Payer: 59 | Admitting: Obstetrics

## 2017-06-15 ENCOUNTER — Ambulatory Visit (INDEPENDENT_AMBULATORY_CARE_PROVIDER_SITE_OTHER): Payer: 59 | Admitting: Obstetrics

## 2017-06-15 ENCOUNTER — Encounter: Payer: Self-pay | Admitting: Obstetrics

## 2017-06-15 ENCOUNTER — Other Ambulatory Visit (HOSPITAL_COMMUNITY)
Admission: RE | Admit: 2017-06-15 | Discharge: 2017-06-15 | Disposition: A | Discharge status: Home / Self Care | Payer: 59 | Source: Ambulatory Visit | Attending: Obstetrics | Admitting: Obstetrics

## 2017-06-15 VITALS — BP 134/86 | HR 77 | Wt 245.9 lb

## 2017-06-15 DIAGNOSIS — F17200 Nicotine dependence, unspecified, uncomplicated: Secondary | ICD-10-CM | POA: Diagnosis not present

## 2017-06-15 DIAGNOSIS — N898 Other specified noninflammatory disorders of vagina: Secondary | ICD-10-CM | POA: Diagnosis present

## 2017-06-15 DIAGNOSIS — Z30015 Encounter for initial prescription of vaginal ring hormonal contraceptive: Secondary | ICD-10-CM | POA: Diagnosis not present

## 2017-06-15 DIAGNOSIS — Z6841 Body Mass Index (BMI) 40.0 and over, adult: Secondary | ICD-10-CM

## 2017-06-15 LAB — POCT URINE PREGNANCY: PREG TEST UR: NEGATIVE

## 2017-06-15 MED ORDER — ETONOGESTREL-ETHINYL ESTRADIOL 0.12-0.015 MG/24HR VA RING: VAGINAL_RING | VAGINAL | 12 refills | Status: DC

## 2017-06-15 MED ORDER — METRONIDAZOLE 500 MG PO TABS: 500.0000 mg | ORAL_TABLET | Freq: Two times a day (BID) | ORAL | 2 refills | Status: DC

## 2017-06-15 NOTE — Progress Notes (Signed)
Subjective:    Jodi Mcdonald is a 26 y.o. female who presents for contraception counseling and advice. The patient has no complaints today. The patient is sexually active. Pertinent past medical history: current smoker.  The information documented in the HPI was reviewed and verified.  Menstrual History: OB History    Gravida Para Term Preterm AB Living   3 2 2  0 1 2   SAB TAB Ectopic Multiple Live Births   1 0 0 0 2       Patient's last menstrual period was 06/04/2017 (exact date).   Patient Active Problem List   Diagnosis Date Noted  . Acute blood loss anemia 12/02/2016  . Gestational HTN 11/22/2016  . Obesity in pregnancy with antepartum complication 09/19/2016  . Obesity (BMI 35.0-39.9 without comorbidity) 09/19/2016  . Encounter for supervision of pregnancy with insufficient prenatal care, antepartum 09/19/2016  . Supervision of other normal pregnancy, antepartum 06/08/2016   Past Medical History:  Diagnosis Date  . Bartholin cyst   . H/O chlamydia infection 2009  . H/O cystitis   . H/O gonorrhea 2012   . History of being obese   . Infection    UTI  . Trichomonas 2009    Past Surgical History:  Procedure Laterality Date  . NO PAST SURGERIES    . WISDOM TOOTH EXTRACTION       Current Outpatient Prescriptions:  .  amLODipine (NORVASC) 5 MG tablet, TAKE 1 TABLET (5 MG TOTAL) BY MOUTH DAILY., Disp: 30 tablet, Rfl: 0 .  etonogestrel-ethinyl estradiol (NUVARING) 0.12-0.015 MG/24HR vaginal ring, Insert vaginally and leave in place for 3 consecutive weeks, then remove for 1 week., Disp: 1 each, Rfl: 12 .  ibuprofen (ADVIL,MOTRIN) 600 MG tablet, Take 1 tablet (600 mg total) by mouth every 6 (six) hours. (Patient not taking: Reported on 01/04/2017), Disp: 30 tablet, Rfl: 0 .  metroNIDAZOLE (FLAGYL) 500 MG tablet, Take 1 tablet (500 mg total) by mouth 2 (two) times daily., Disp: 14 tablet, Rfl: 2 .  Prenatal Vit-Fe Fumarate-FA (PRENATAL MULTIVITAMIN) TABS tablet, Take 1  tablet by mouth daily., Disp: , Rfl:  No Known Allergies  Social History  Substance Use Topics  . Smoking status: Current Every Day Smoker    Types: Cigarettes  . Smokeless tobacco: Former NeurosurgeonUser     Comment: 1-2 daily   . Alcohol use No     Comment: ocassionally    Family History  Problem Relation Age of Onset  . Depression Mother   . Hypertension Mother   . Hypertension Father   . Diabetes Maternal Grandmother   . Diabetes Maternal Aunt   . Cancer Paternal Aunt   . Anesthesia problems Neg Hx        Review of Systems Constitutional: negative for weight loss Genitourinary:negative for abnormal menstrual periods and vaginal discharge   Objective:   BP 134/86   Pulse 77   Wt 245 lb 14.4 oz (111.5 kg)   LMP 06/04/2017 (Exact Date)   Breastfeeding? No   BMI 42.21 kg/m    General:   alert  Skin:   no rash or abnormalities  Lungs:   clear to auscultation bilaterally  Heart:   regular rate and rhythm, S1, S2 normal, no murmur, click, rub or gallop  Breasts:   normal without suspicious masses, skin or nipple changes or axillary nodes  Abdomen:  normal findings: no organomegaly, soft, non-tender and no hernia  Pelvis:  External genitalia: normal general appearance Urinary system: urethral meatus normal and  bladder without fullness, nontender Vaginal: normal without tenderness, induration or masses Cervix: normal appearance Adnexa: normal bimanual exam Uterus: anteverted and non-tender, normal size   Lab Review Urine pregnancy test Labs reviewed yes Radiologic studies reviewed no  50% of 15 min visit spent on counseling and coordination of care.    Assessment:    26 y.o., starting NuvaRing vaginal inserts, no contraindications.   Plan:    All questions answered. Chlamydia specimen. Contraception: NuvaRing vaginal inserts. Discussed healthy lifestyle modifications. Agricultural engineer distributed. Follow up in 3 months. GC specimen. Wet prep.    Meds  ordered this encounter  Medications  . metroNIDAZOLE (FLAGYL) 500 MG tablet    Sig: Take 1 tablet (500 mg total) by mouth 2 (two) times daily.    Dispense:  14 tablet    Refill:  2  . etonogestrel-ethinyl estradiol (NUVARING) 0.12-0.015 MG/24HR vaginal ring    Sig: Insert vaginally and leave in place for 3 consecutive weeks, then remove for 1 week.    Dispense:  1 each    Refill:  12   Orders Placed This Encounter  Procedures  . POCT urine pregnancy

## 2017-06-15 NOTE — Progress Notes (Signed)
Patient presents for Birth Control, she wants the NuvaRing. She also complains of having a discharge with odor, denies itching.  She had unprotected sex 2 days ago, stopped taking the pill a month and half ago.

## 2017-06-19 ENCOUNTER — Other Ambulatory Visit: Payer: Self-pay | Admitting: Obstetrics

## 2017-06-19 LAB — CERVICOVAGINAL ANCILLARY ONLY
Bacterial vaginitis: POSITIVE — AB
CHLAMYDIA, DNA PROBE: NEGATIVE
Candida vaginitis: NEGATIVE
NEISSERIA GONORRHEA: NEGATIVE
TRICH (WINDOWPATH): NEGATIVE

## 2018-03-27 ENCOUNTER — Ambulatory Visit (INDEPENDENT_AMBULATORY_CARE_PROVIDER_SITE_OTHER): Payer: 59 | Admitting: Obstetrics & Gynecology

## 2018-03-27 ENCOUNTER — Encounter: Payer: Self-pay | Admitting: Obstetrics & Gynecology

## 2018-03-27 VITALS — BP 117/82 | HR 90 | Ht 64.0 in | Wt 246.0 lb

## 2018-03-27 DIAGNOSIS — Z124 Encounter for screening for malignant neoplasm of cervix: Secondary | ICD-10-CM

## 2018-03-27 DIAGNOSIS — N760 Acute vaginitis: Secondary | ICD-10-CM | POA: Diagnosis not present

## 2018-03-27 DIAGNOSIS — Z01419 Encounter for gynecological examination (general) (routine) without abnormal findings: Secondary | ICD-10-CM

## 2018-03-27 DIAGNOSIS — Z113 Encounter for screening for infections with a predominantly sexual mode of transmission: Secondary | ICD-10-CM | POA: Diagnosis not present

## 2018-03-27 DIAGNOSIS — Z30015 Encounter for initial prescription of vaginal ring hormonal contraceptive: Secondary | ICD-10-CM

## 2018-03-27 MED ORDER — ETONOGESTREL-ETHINYL ESTRADIOL 0.12-0.015 MG/24HR VA RING: VAGINAL_RING | VAGINAL | 12 refills | Status: DC

## 2018-03-27 MED ORDER — METRONIDAZOLE 500 MG PO TABS: 500.0000 mg | ORAL_TABLET | Freq: Two times a day (BID) | ORAL | 0 refills | Status: DC

## 2018-03-27 NOTE — Progress Notes (Signed)
Subjective:    Jodi Mcdonald is a 27 y.o. single (living together) P 2 (16 yo son, 1 yo daughter) female who presents for an annual exam. She is having a not normal vaginal smell for a week.  The patient is sexually active. GYN screening history: last pap: was normal. The patient wears seatbelts: yes. The patient participates in regular exercise: no. Has the patient ever been transfused or tattooed?: yes. The patient reports that there is not domestic violence in her life.   Menstrual History: OB History    Gravida  3   Para  2   Term  2   Preterm  0   AB  1   Living  2     SAB  1   TAB  0   Ectopic  0   Multiple  0   Live Births  2           Menarche age: 41 Patient's last menstrual period was 03/11/2018 (exact date).    The following portions of the patient's history were reviewed and updated as appropriate: allergies, current medications, past family history, past medical history, past social history, past surgical history and problem list.  Review of Systems Pertinent items are noted in HPI.   FH- no breast/gyn/colon cancer Monogamous for more than a year, denies dyspareunia Uses Nuvaring (happy with it)     Objective:    BP 117/82   Pulse 90   Ht 5\' 4"  (1.626 m)   Wt 246 lb (111.6 kg)   LMP 03/11/2018 (Exact Date)   BMI 42.23 kg/m   General Appearance:    Alert, cooperative, no distress, appears stated age  Head:    Normocephalic, without obvious abnormality, atraumatic  Eyes:    PERRL, conjunctiva/corneas clear, EOM's intact, fundi    benign, both eyes  Ears:    Normal TM's and external ear canals, both ears  Nose:   Nares normal, septum midline, mucosa normal, no drainage    or sinus tenderness  Throat:   Lips, mucosa, and tongue normal; teeth and gums normal  Neck:   Supple, symmetrical, trachea midline, no adenopathy;    thyroid:  no enlargement/tenderness/nodules; no carotid   bruit or JVD  Back:     Symmetric, no curvature, ROM normal, no  CVA tenderness  Lungs:     Clear to auscultation bilaterally, respirations unlabored  Chest Wall:    No tenderness or deformity   Heart:    Regular rate and rhythm, S1 and S2 normal, no murmur, rub   or gallop  Breast Exam:    No tenderness, masses, or nipple abnormality  Abdomen:     Soft, non-tender, bowel sounds active all four quadrants,    no masses, no organomegaly  Genitalia:    Normal female without lesion, discharge or tenderness, normal size and shape, anteverted, mobile, non-tender, normal adnexal exam Discharge c/w BV     Extremities:   Extremities normal, atraumatic, no cyanosis or edema  Pulses:   2+ and symmetric all extremities  Skin:   Skin color, texture, turgor normal, no rashes or lesions  Lymph nodes:   Cervical, supraclavicular, and axillary nodes normal  Neurologic:   CNII-XII intact, normal strength, sensation and reflexes    throughout  .    Assessment:    Healthy female exam.    Plan:     Contraception: continue Nuvaring. Discussed healthy lifestyle modifications.   STI testing per patient request (she declines blood) Testing for BV,  yeast off of pap smear done Rec no smoking Treat for BV (seen on exam)

## 2018-03-27 NOTE — Progress Notes (Signed)
Patient presents for Annual Exam today.   Last pap:06/07/16 LMP:03/11/18 Contraception: Nuva Ring  STD Screening: Full panel   CC: Possible BV infection x 1 week now.

## 2018-03-28 LAB — CYTOLOGY - PAP
Bacterial vaginitis: POSITIVE — AB
CANDIDA VAGINITIS: NEGATIVE
Chlamydia: NEGATIVE
Diagnosis: NEGATIVE
NEISSERIA GONORRHEA: NEGATIVE
Trichomonas: NEGATIVE

## 2018-08-28 ENCOUNTER — Ambulatory Visit: Payer: 59 | Admitting: Obstetrics and Gynecology

## 2018-09-10 ENCOUNTER — Ambulatory Visit (INDEPENDENT_AMBULATORY_CARE_PROVIDER_SITE_OTHER): Payer: 59 | Admitting: Obstetrics and Gynecology

## 2018-09-10 ENCOUNTER — Encounter: Payer: Self-pay | Admitting: Obstetrics and Gynecology

## 2018-09-10 DIAGNOSIS — Z113 Encounter for screening for infections with a predominantly sexual mode of transmission: Secondary | ICD-10-CM | POA: Diagnosis not present

## 2018-09-10 DIAGNOSIS — N898 Other specified noninflammatory disorders of vagina: Secondary | ICD-10-CM

## 2018-09-10 NOTE — Progress Notes (Signed)
Patient is in the office, complains of vaginal discharge and odor. Pt desires std testing.

## 2018-09-10 NOTE — Progress Notes (Signed)
Patient ID: Jodi Mcdonald, female   DOB: [birth year], 27 y.o.   MRN: [id number] Ms Jackquline BoschSeegars presents with 1 week of vaginal discharge and odor. H/O of BV in the past Sexual active, same partner No contraception LMP 08/12/18  PE AF VSS Lungs clear Heart RRR Abd soft + BS  A/P Vaginal Discharge Self swab collected Pt will be contact with results and treated as indicated F/U PRN

## 2018-09-11 LAB — CERVICOVAGINAL ANCILLARY ONLY
Bacterial vaginitis: POSITIVE — AB
Candida vaginitis: POSITIVE — AB
Chlamydia: NEGATIVE
Neisseria Gonorrhea: NEGATIVE
TRICH (WINDOWPATH): NEGATIVE

## 2018-09-12 ENCOUNTER — Other Ambulatory Visit: Payer: Self-pay

## 2018-09-12 DIAGNOSIS — N898 Other specified noninflammatory disorders of vagina: Secondary | ICD-10-CM

## 2018-09-12 MED ORDER — FLUCONAZOLE 150 MG PO TABS: 150.0000 mg | ORAL_TABLET | Freq: Once | ORAL | 0 refills | Status: AC

## 2018-09-12 MED ORDER — METRONIDAZOLE 500 MG PO TABS: 500.0000 mg | ORAL_TABLET | Freq: Two times a day (BID) | ORAL | 0 refills | Status: DC

## 2019-01-15 ENCOUNTER — Encounter (HOSPITAL_COMMUNITY): Payer: Self-pay | Admitting: Emergency Medicine

## 2019-01-15 ENCOUNTER — Inpatient Hospital Stay (HOSPITAL_COMMUNITY)
Admission: AD | Admit: 2019-01-15 | Discharge: 2019-01-15 | Disposition: A | Discharge status: Home / Self Care | Payer: 59 | Attending: Obstetrics and Gynecology | Admitting: Obstetrics and Gynecology

## 2019-01-15 DIAGNOSIS — F17210 Nicotine dependence, cigarettes, uncomplicated: Secondary | ICD-10-CM | POA: Insufficient documentation

## 2019-01-15 DIAGNOSIS — N898 Other specified noninflammatory disorders of vagina: Secondary | ICD-10-CM

## 2019-01-15 DIAGNOSIS — R1030 Lower abdominal pain, unspecified: Secondary | ICD-10-CM | POA: Insufficient documentation

## 2019-01-15 LAB — WET PREP, GENITAL
Clue Cells Wet Prep HPF POC: NONE SEEN
SPERM: NONE SEEN
TRICH WET PREP: NONE SEEN
Yeast Wet Prep HPF POC: NONE SEEN

## 2019-01-15 LAB — URINALYSIS, ROUTINE W REFLEX MICROSCOPIC
BILIRUBIN URINE: NEGATIVE
GLUCOSE, UA: NEGATIVE mg/dL
Hgb urine dipstick: NEGATIVE
KETONES UR: NEGATIVE mg/dL
Leukocytes, UA: NEGATIVE
Nitrite: NEGATIVE
PH: 6 (ref 5.0–8.0)
Protein, ur: NEGATIVE mg/dL
SPECIFIC GRAVITY, URINE: 1.024 (ref 1.005–1.030)

## 2019-01-15 LAB — POCT PREGNANCY, URINE: Preg Test, Ur: NEGATIVE

## 2019-01-15 NOTE — MAU Provider Note (Signed)
History   Chief Complaint  Patient presents with  . Abdominal Pain  . Vaginal Discharge   Jodi Mcdonald is a 28yo [phone] female presenting today with vaginal discharge that started last week and abdominal cramping that started yesterday with a faintly positive at home pregnancy test. She described the discharge as "watery" with a "slight odor that smells a little fishy." Patient also reports dyspareunia and vaginal dryness but denies vaginal bleeding and urinary symptoms. She denies recent douching but reports use in the past. Patient reports a history of BV and yeast infections that were more common during her pregnancies. Patient states that she took an at home pregnancy test after missing her period which was due 6 days ago (1/15) with some spotting on 1/8. She is sexually active in a monogamous relationship and reports recently discontinuing the NuvaRing after it was no longer covered by her insurance. Patient does not use condoms or any other forms of contraceptive.   Pertinent Gynecological History: Menses: regular every 21 days without intermenstrual spotting Contraception: none. Recently discontinued the NuvaRing d/t cost. Has not started any other form of contraceptive.  Sexually transmitted diseases: past history: CT and Trich (2009), GC (2012)   Past Medical History:  Diagnosis Date  . Bartholin cyst   . H/O chlamydia infection 2009  . H/O cystitis   . H/O gonorrhea 2012   . History of being obese   . Infection    UTI  . Trichomonas 2009    Past Surgical History:  Procedure Laterality Date  . NO PAST SURGERIES    . WISDOM TOOTH EXTRACTION      Family History  Problem Relation Age of Onset  . Depression Mother   . Hypertension Mother   . Hypertension Father   . Diabetes Maternal Grandmother   . Diabetes Maternal Aunt   . Cancer Paternal Aunt   . Anesthesia problems Neg Hx     Social History   Tobacco Use  . Smoking status: Current Every Day Smoker   Packs/day: 2.00    Types: Cigars  . Smokeless tobacco: Former Neurosurgeon  . Tobacco comment: 1-2 daily   Substance Use Topics  . Alcohol use: No    Comment: ocassionally  . Drug use: No    Allergies: No Known Allergies  Medications Prior to Admission  Medication Sig Dispense Refill Last Dose  . metroNIDAZOLE (FLAGYL) 500 MG tablet Take 1 tablet (500 mg total) by mouth 2 (two) times daily. 14 tablet 0 More than a month at Unknown time  . Prenatal Vit-Fe Fumarate-FA (PRENATAL MULTIVITAMIN) TABS tablet Take 1 tablet by mouth daily.   More than a month at Unknown time    Review of Systems  Constitutional: Negative for fever.  Gastrointestinal: Positive for abdominal pain ("cramping" suprapubic). Negative for nausea and vomiting.  Genitourinary: Negative for dysuria, flank pain, frequency, hematuria and urgency.       Positive for vaginal discharge ("watery", "foul smelling"), vaginal dryness, and dyspareunia. No vaginal bleeding.    Physical Exam Blood pressure 111/79, pulse 78, temperature 98.1 F (36.7 C), temperature source Oral, resp. rate 17, height 5\' 4"  (1.626 m), last menstrual period 01/02/2019, SpO2 100 %. Physical Exam  Constitutional: She is oriented to person, place, and time. She appears well-developed and well-nourished.  HENT:  Head: Normocephalic.  Cardiovascular: Normal rate.  Respiratory: Effort normal.  GI: Soft. There is no abdominal tenderness.  Genitourinary: Uterus is not tender. Cervix exhibits discharge (white, cloudy. No foul odor noted). Cervix  exhibits no motion tenderness.    No vaginal erythema, tenderness or bleeding.  No erythema, tenderness or bleeding in the vagina.    Genitourinary Comments: Adnexa negative for tenderness or fullness.   Neurological: She is alert and oriented to person, place, and time.  Psychiatric: She has a normal mood and affect. Her behavior is normal.    MAU Course Urine hCG: negative UA to r/o UTI: negative Wet prep to r/o  yeast, BV, and trich. Results showed few WBC but otherwise normal.  GC, chlamydia: Specimen collected, results pending   MDM Vaginal discharge: negative for yeast, BV, and trich. GC and chlamydia results pending and will be communicated with patients. Patient advised to avoid douching and start using a pH neutral soap. She has no questions or concerns and is stable for discharge. Patient instructed to return if symptoms worsen or persist.   Contraception: Patient expressed interest in IUD placement. Resources were provided in discharge papers and all questions were answered.    Kadeshia Kasparian PA-S 01/15/19

## 2019-01-15 NOTE — MAU Provider Note (Signed)
History     CSN: [id number]  Arrival date and time: 01/15/19 [id number]   First Provider Initiated Contact with Patient 01/15/19 1035      Chief Complaint  Patient presents with  . Abdominal Pain  . Vaginal Discharge   Jodi Mcdonald is a 28 y.o. female who presents for Abdominal Pain and Vaginal Discharge.   She reports that she has had vaginal discharge for the last week and was "waiting to see if it would stop."  Patient does endorse an odor, but denies itching or burning.  However, she does report discomfort during sexual activity while denying issues with urination, diarrhea, or constipation.  Patient states she started to experience lower abdominal cramping yesterday and thought it was her menses.  She has been having spotting due to discontinuation of her nuva ring.  Patient states she had a "faint" positive pregnancy test at home yesterday.      OB History    Gravida  3   Para  2   Term  2   Preterm  0   AB  1   Living  2     SAB  1   TAB  0   Ectopic  0   Multiple  0   Live Births  2           Past Medical History:  Diagnosis Date  . Bartholin cyst   . H/O chlamydia infection 2009  . H/O cystitis   . H/O gonorrhea 2012   . History of being obese   . Infection    UTI  . Trichomonas 2009    Past Surgical History:  Procedure Laterality Date  . NO PAST SURGERIES    . WISDOM TOOTH EXTRACTION      Family History  Problem Relation Age of Onset  . Depression Mother   . Hypertension Mother   . Hypertension Father   . Diabetes Maternal Grandmother   . Diabetes Maternal Aunt   . Cancer Paternal Aunt   . Anesthesia problems Neg Hx     Social History   Tobacco Use  . Smoking status: Current Every Day Smoker    Packs/day: 2.00    Types: Cigars  . Smokeless tobacco: Former NeurosurgeonUser  . Tobacco comment: 1-2 daily   Substance Use Topics  . Alcohol use: No    Comment: ocassionally  . Drug use: No    Allergies: No Known Allergies  Medications  Prior to Admission  Medication Sig Dispense Refill Last Dose  . metroNIDAZOLE (FLAGYL) 500 MG tablet Take 1 tablet (500 mg total) by mouth 2 (two) times daily. 14 tablet 0 More than a month at Unknown time  . Prenatal Vit-Fe Fumarate-FA (PRENATAL MULTIVITAMIN) TABS tablet Take 1 tablet by mouth daily.   More than a month at Unknown time    Review of Systems  Genitourinary: Positive for dyspareunia, vaginal bleeding (Spotting) and vaginal discharge.   Physical Exam   Blood pressure 111/79, pulse 78, temperature 98.1 F (36.7 C), temperature source Oral, resp. rate 17, height 5\' 4"  (1.626 m), last menstrual period 01/02/2019, SpO2 100 %.  Physical Exam  Constitutional: She is oriented to person, place, and time. She appears well-developed and well-nourished.  HENT:  Head: Normocephalic and atraumatic.  Eyes: Conjunctivae are normal.  Neck: Normal range of motion.  Cardiovascular: Normal rate.  Respiratory: Effort normal.  GI: Soft.  Genitourinary: Uterus is not enlarged. Cervix exhibits discharge. Cervix exhibits no motion tenderness.    Genitourinary Comments:  Speculum Exam: -Vaginal Vault: Pink mucosa, mild vaginal wall prolapse noted -wet prep collected -Cervix:Large, pink, no lesions, cysts, or polyps.  Mod amt milky white discharge from os-GC/CT collected -Bimanual Exam: No CMT, Uterus WNL No tenderness in cul de sac    Musculoskeletal: Normal range of motion.  Neurological: She is alert and oriented to person, place, and time.  Skin: Skin is warm and dry.  Psychiatric: She has a normal mood and affect. Her behavior is normal.     MAU Course  Procedures Results for orders placed or performed during the hospital encounter of 01/15/19 (from the past 24 hour(s))  Urinalysis, Routine w reflex microscopic     Status: None   Collection Time: 01/15/19  9:19 AM  Result Value Ref Range   Color, Urine YELLOW YELLOW   APPearance CLEAR CLEAR   Specific Gravity, Urine 1.024 1.005 -  1.030   pH 6.0 5.0 - 8.0   Glucose, UA NEGATIVE NEGATIVE mg/dL   Hgb urine dipstick NEGATIVE NEGATIVE   Bilirubin Urine NEGATIVE NEGATIVE   Ketones, ur NEGATIVE NEGATIVE mg/dL   Protein, ur NEGATIVE NEGATIVE mg/dL   Nitrite NEGATIVE NEGATIVE   Leukocytes, UA NEGATIVE NEGATIVE  Pregnancy, urine POC     Status: None   Collection Time: 01/15/19  9:39 AM  Result Value Ref Range   Preg Test, Ur NEGATIVE NEGATIVE  Wet prep, genital     Status: Abnormal   Collection Time: 01/15/19 10:27 AM  Result Value Ref Range   Yeast Wet Prep HPF POC NONE SEEN NONE SEEN   Trich, Wet Prep NONE SEEN NONE SEEN   Clue Cells Wet Prep HPF POC NONE SEEN NONE SEEN   WBC, Wet Prep HPF POC FEW (A) NONE SEEN   Sperm NONE SEEN     MDM Pelvic Exam with cultures Labs: UA, Wet prep, and GC/CT  Assessment and Plan  28 y.o. Female Vaginal Discharge Abdominal Cramping  -Exam findings discussed -Labs Pending -Will return with results   Follow Up (1055) -Wet prep returns back negative with exception of few WBCs -Results discussed with patient. -Educated on vaginal spotting and abnormal bleeding after abrupt discontinuation of BCM.  -Encouraged to schedule appt for contraception management -Information sheet given on IUD which patient expressed interest in possibly receiving.  -Encouraged to call or return to MAU if symptoms worsen or with the onset of new symptoms. -Discharged to home in stable condition  Cherre Robins MSN, CNM 01/15/2019, 10:35 AM

## 2019-01-15 NOTE — Discharge Instructions (Signed)

## 2019-01-15 NOTE — MAU Note (Addendum)
Pt presents to MAU with co of cramping since yesterday and vaginal dc that smells. Pt states that she has a faint positive preg test at home.

## 2019-01-17 LAB — GC/CHLAMYDIA PROBE AMP (~~LOC~~) NOT AT ARMC
Chlamydia: NEGATIVE
Neisseria Gonorrhea: NEGATIVE

## 2019-04-29 ENCOUNTER — Other Ambulatory Visit: Payer: Self-pay

## 2019-04-29 ENCOUNTER — Ambulatory Visit: Payer: 59 | Admitting: Obstetrics

## 2019-04-29 ENCOUNTER — Encounter: Payer: Self-pay | Admitting: Obstetrics

## 2019-04-29 DIAGNOSIS — Z3009 Encounter for other general counseling and advice on contraception: Principal | ICD-10-CM

## 2019-04-29 NOTE — Progress Notes (Signed)
GYN TELEHEALTH patient wants to discuss contraception Patient last seen 09/10/2018 for vaginal discharge  LMP: Unprotected intercourse:  Patient did not answer phone for GYN TELEHEALTH VISIT.  Brock Bad MD 04-29-2019

## 2019-05-08 ENCOUNTER — Encounter: Payer: Self-pay | Admitting: Obstetrics

## 2019-05-08 ENCOUNTER — Other Ambulatory Visit: Payer: Self-pay

## 2019-05-08 ENCOUNTER — Ambulatory Visit (INDEPENDENT_AMBULATORY_CARE_PROVIDER_SITE_OTHER): Payer: 59 | Admitting: Obstetrics

## 2019-05-08 DIAGNOSIS — Z3009 Encounter for other general counseling and advice on contraception: Secondary | ICD-10-CM

## 2019-05-08 NOTE — Progress Notes (Signed)
   TELEHEALTH [facility] GYNECOLOGY VISIT ENCOUNTER NOTE  I connected with Jodi Mcdonald on 05/08/19 at  1:45 PM EDT by WebEx at home and verified that I am speaking with the correct person using two identifiers.   I discussed the limitations, risks, security and privacy concerns of performing an evaluation and management service by telephone and the availability of in person appointments. I also discussed with the patient that there may be a patient responsible charge related to this service. The patient expressed understanding and agreed to proceed.   History:  Jodi Mcdonald is a 28 y.o. [phone] female being evaluated today for contraceptive counseling. She denies any abnormal vaginal discharge, bleeding, pelvic pain or other concerns.       Past Medical History:  Diagnosis Date  . Bartholin cyst   . H/O chlamydia infection 2009  . H/O cystitis   . H/O gonorrhea 2012   . History of being obese   . Infection    UTI  . Trichomonas 2009   Past Surgical History:  Procedure Laterality Date  . NO PAST SURGERIES    . WISDOM TOOTH EXTRACTION     The following portions of the patient's history were reviewed and updated as appropriate: allergies, current medications, past family history, past medical history, past social history, past surgical history and problem list.   Health Maintenance:  Normal pap on 03-27-2018.    Review of Systems:  Pertinent items noted in HPI and remainder of comprehensive ROS otherwise negative.  Physical Exam:   General:  Alert, oriented and cooperative. Patient appears to be in no acute distress.  Mental Status: Normal mood and affect. Normal behavior. Normal judgment and thought content.   Respiratory: Normal respiratory effort, no problems with respiration noted  Rest of physical exam deferred due to type of encounter  Labs and Imaging No results found for this or any previous visit (from the past 336 hour(s)). No results found.     Assessment and Plan:      1. Encounter for other general counseling and advice on contraception - wants IUD       I discussed the assessment and treatment plan with the patient. The patient was provided an opportunity to ask questions and all were answered. The patient agreed with the plan and demonstrated an understanding of the instructions.   The patient was advised to call back or seek an in-person evaluation/go to the ED if the symptoms worsen or if the condition fails to improve as anticipated.  I provided 10 minutes of face-to-face via WebEx time during this encounter.   Coral Ceo, MD [facility] for [facility], [facility] 05-08-2019

## 2019-05-08 NOTE — Progress Notes (Signed)
GYN WEBEX to discuss contraception options.  Pt considering IUD.  DPO:EUMPNT  Last pap:03/27/2018  WNL  Contraception Currently:Condoms    CC: + UPT 04/01/19  *04/11/19  Pt had miscarriage last month pt states she did not go to hospital   Pt states recent UPT was Neg 3 days ago.

## 2019-05-23 ENCOUNTER — Other Ambulatory Visit: Payer: Self-pay

## 2019-05-23 ENCOUNTER — Ambulatory Visit (INDEPENDENT_AMBULATORY_CARE_PROVIDER_SITE_OTHER): Payer: 59 | Admitting: Obstetrics & Gynecology

## 2019-05-23 ENCOUNTER — Encounter: Payer: Self-pay | Admitting: Obstetrics & Gynecology

## 2019-05-23 VITALS — BP 119/84 | HR 81 | Wt 221.0 lb

## 2019-05-23 DIAGNOSIS — Z30430 Encounter for insertion of intrauterine contraceptive device: Secondary | ICD-10-CM

## 2019-05-23 DIAGNOSIS — Z3202 Encounter for pregnancy test, result negative: Secondary | ICD-10-CM | POA: Diagnosis not present

## 2019-05-23 LAB — POCT URINE PREGNANCY: Preg Test, Ur: NEGATIVE

## 2019-05-23 MED ORDER — LEVONORGESTREL 19.5 MCG/DAY IU IUD
INTRAUTERINE_SYSTEM | Freq: Once | INTRAUTERINE | Status: AC
  Administered 2019-05-23: 14:00:00 via INTRAUTERINE

## 2019-05-23 NOTE — Progress Notes (Signed)
Pt is in the office for IUD insertion MIRENA IUD NOT INSTOCK AT THIS TIME.  Pt needs to discuss w/ provider which device she would like to have.  UPT : Negative

## 2019-05-23 NOTE — Progress Notes (Signed)
Patient ID: Jodi Mcdonald, female   DOB: Dec 01, 1991, 28 y.o.   MRN: [id number]      GYNECOLOGY OFFICE PROCEDURE NOTE  Jodi Mcdonald is a 28 y.o. [id number] here for Liletta IUD insertion. No GYN concerns.  Last pap smear was on 03/27/18 and was normal.  IUD Insertion Procedure Note Patient identified, informed consent performed, consent signed.   Discussed risks of irregular bleeding, cramping, infection, malpositioning or misplacement of the IUD outside the uterus which may require further procedure such as laparoscopy. Time out was performed.  Urine pregnancy test negative.  Speculum placed in the vagina.  Cervix visualized.  Cleaned with Betadine x 2.  Grasped anteriorly with a single tooth tenaculum.  Uterus sounded to 8 cm.  Liletta IUD placed per manufacturer's recommendations.  Strings trimmed to 3 cm. Tenaculum was removed, good hemostasis noted.  Patient tolerated procedure well.   Patient was given post-procedure instructions.  She was advised to have backup contraception for one week.  Patient was also asked to check IUD strings periodically and follow up in 4 weeks for IUD check.   Scheryl Darter, MD, FACOG Attending Obstetrician & Gynecologist, [facility] for [facility], [facility]

## 2019-05-23 NOTE — Patient Instructions (Signed)
Levonorgestrel intrauterine device (IUD) Liletta What is this medicine? LEVONORGESTREL IUD (LEE voe nor jes trel) is a contraceptive (birth control) device. The device is placed inside the uterus by a healthcare professional. It is used to prevent pregnancy. This device can also be used to treat heavy bleeding that occurs during your period. This medicine may be used for other purposes; ask your health care provider or pharmacist if you have questions. COMMON BRAND NAME(S): Cameron AliKyleena, LILETTA, Mirena, Skyla What should I tell my health care provider before I take this medicine? They need to know if you have any of these conditions: -abnormal Pap smear -cancer of the breast, uterus, or cervix -diabetes -endometritis -genital or pelvic infection now or in the past -have more than one sexual partner or your partner has more than one partner -heart disease -history of an ectopic or tubal pregnancy -immune system problems -IUD in place -liver disease or tumor -problems with blood clots or take blood-thinners -seizures -use intravenous drugs -uterus of unusual shape -vaginal bleeding that has not been explained -an unusual or allergic reaction to levonorgestrel, other hormones, silicone, or polyethylene, medicines, foods, dyes, or preservatives -pregnant or trying to get pregnant -breast-feeding How should I use this medicine? This device is placed inside the uterus by a health care professional. Talk to your pediatrician regarding the use of this medicine in children. Special care may be needed. Overdosage: If you think you have taken too much of this medicine contact a poison control center or emergency room at once. NOTE: This medicine is only for you. Do not share this medicine with others. What if I miss a dose? This does not apply. Depending on the brand of device you have inserted, the device will need to be replaced every 3 to 5 years if you wish to continue using this type of birth  control. What may interact with this medicine? Do not take this medicine with any of the following medications: -amprenavir -bosentan -fosamprenavir This medicine may also interact with the following medications: -aprepitant -armodafinil -barbiturate medicines for inducing sleep or treating seizures -bexarotene -boceprevir -griseofulvin -medicines to treat seizures like carbamazepine, ethotoin, felbamate, oxcarbazepine, phenytoin, topiramate -modafinil -pioglitazone -rifabutin -rifampin -rifapentine -some medicines to treat HIV infection like atazanavir, efavirenz, indinavir, lopinavir, nelfinavir, tipranavir, ritonavir -St. John's wort -warfarin This list may not describe all possible interactions. Give your health care provider a list of all the medicines, herbs, non-prescription drugs, or dietary supplements you use. Also tell them if you smoke, drink alcohol, or use illegal drugs. Some items may interact with your medicine. What should I watch for while using this medicine? Visit your doctor or health care professional for regular check ups. See your doctor if you or your partner has sexual contact with others, becomes HIV positive, or gets a sexual transmitted disease. This product does not protect you against HIV infection (AIDS) or other sexually transmitted diseases. You can check the placement of the IUD yourself by reaching up to the top of your vagina with clean fingers to feel the threads. Do not pull on the threads. It is a good habit to check placement after each menstrual period. Call your doctor right away if you feel more of the IUD than just the threads or if you cannot feel the threads at all. The IUD may come out by itself. You may become pregnant if the device comes out. If you notice that the IUD has come out use a backup birth control method like condoms and call  your health care provider. Using tampons will not change the position of the IUD and are okay to use  during your period. This IUD can be safely scanned with magnetic resonance imaging (MRI) only under specific conditions. Before you have an MRI, tell your healthcare provider that you have an IUD in place, and which type of IUD you have in place. What side effects may I notice from receiving this medicine? Side effects that you should report to your doctor or health care professional as soon as possible: -allergic reactions like skin rash, itching or hives, swelling of the face, lips, or tongue -fever, flu-like symptoms -genital sores -high blood pressure -no menstrual period for 6 weeks during use -pain, swelling, warmth in the leg -pelvic pain or tenderness -severe or sudden headache -signs of pregnancy -stomach cramping -sudden shortness of breath -trouble with balance, talking, or walking -unusual vaginal bleeding, discharge -yellowing of the eyes or skin Side effects that usually do not require medical attention (report to your doctor or health care professional if they continue or are bothersome): -acne -breast pain -change in sex drive or performance -changes in weight -cramping, dizziness, or faintness while the device is being inserted -headache -irregular menstrual bleeding within first 3 to 6 months of use -nausea This list may not describe all possible side effects. Call your doctor for medical advice about side effects. You may report side effects to FDA at 1-800-FDA-1088. Where should I keep my medicine? This does not apply. NOTE: This sheet is a summary. It may not cover all possible information. If you have questions about this medicine, talk to your doctor, pharmacist, or health care provider.  2019 Elsevier/Gold Standard (2016-09-23 14:14:56)

## 2019-06-20 ENCOUNTER — Ambulatory Visit: Payer: Self-pay | Admitting: Certified Nurse Midwife

## 2019-06-26 ENCOUNTER — Ambulatory Visit: Payer: Self-pay | Admitting: Obstetrics & Gynecology

## 2019-07-09 ENCOUNTER — Ambulatory Visit: Payer: Self-pay | Admitting: Obstetrics & Gynecology

## 2019-07-22 ENCOUNTER — Ambulatory Visit (INDEPENDENT_AMBULATORY_CARE_PROVIDER_SITE_OTHER): Payer: Self-pay | Admitting: Advanced Practice Midwife

## 2019-07-22 ENCOUNTER — Other Ambulatory Visit: Payer: Self-pay

## 2019-07-22 VITALS — BP 118/75 | HR 80 | Ht 64.0 in | Wt 227.0 lb

## 2019-07-22 DIAGNOSIS — Z113 Encounter for screening for infections with a predominantly sexual mode of transmission: Secondary | ICD-10-CM

## 2019-07-22 DIAGNOSIS — Z30431 Encounter for routine checking of intrauterine contraceptive device: Secondary | ICD-10-CM

## 2019-07-22 DIAGNOSIS — N898 Other specified noninflammatory disorders of vagina: Secondary | ICD-10-CM

## 2019-07-22 NOTE — Progress Notes (Signed)
    GYNECOLOGY OFFICE ENCOUNTER NOTE  History:  Jodi Mcdonald is a 28 y.o. year old female here today for today for IUD string check; Liletta IUD was placed 05/23/19.   Reports the following since IUD insertion: Bleeding Lighter periods Abd pain: Denies Fever: denies Dyspareunia: Denies  The following portions of the patient's history were reviewed and updated as appropriate: allergies, current medications, past family history, past medical history, past social history, past surgical history and problem list. Last pap smear on 2019 was normal.  Review of Systems:  Pertinent items are noted in HPI.   Objective:  Physical Exam Blood pressure 101/70, pulse 75, height 5\' 4"  (1.626 m), weight 160 lb (72.6 kg), last menstrual period 02/16/2019, currently breastfeeding. CONSTITUTIONAL: Well-developed, well-nourished female in no acute distress.  SKIN: No pallor HENT:  Normocephalic, atraumatic. External right and left ear normal. Oropharynx is clear and moist EYES: Conjunctivae. No scleral icterus.  CARDIOVASCULAR: Normal heart rate noted RESPIRATORY: Effort normal, no problems with respiration noted ABDOMEN: Soft, no distention noted.   PELVIC: Normal appearing external genitalia; normal appearing vaginal mucosa and cervix,  Assessment & Plan:  Patient to keep IUD in place for up to 6 years; can come in for removal if she desires pregnancy earlier or for any concerning side effects. Pap due 2022 Annual Gyn due [place], [place], [place] 07/22/2019 3:58 PM

## 2019-07-22 NOTE — Patient Instructions (Signed)
Preventive Care 21-26 Years Old, Female Preventive care refers to visits with your health care provider and lifestyle choices that can promote health and wellness. This includes:  A yearly physical exam. This may also be called an annual well check.  Regular dental visits and eye exams.  Immunizations.  Screening for certain conditions.  Healthy lifestyle choices, such as eating a healthy diet, getting regular exercise, not using drugs or products that contain nicotine and tobacco, and limiting alcohol use. What can I expect for my preventive care visit? Physical exam Your health care provider will check your:  Height and weight. This may be used to calculate body mass index (BMI), which tells if you are at a healthy weight.  Heart rate and blood pressure.  Skin for abnormal spots. Counseling Your health care provider may ask you questions about your:  Alcohol, tobacco, and drug use.  Emotional well-being.  Home and relationship well-being.  Sexual activity.  Eating habits.  Work and work environment.  Method of birth control.  Menstrual cycle.  Pregnancy history. What immunizations do I need?  Influenza (flu) vaccine  This is recommended every year. Tetanus, diphtheria, and pertussis (Tdap) vaccine  You may need a Td booster every 10 years. Varicella (chickenpox) vaccine  You may need this if you have not been vaccinated. Human papillomavirus (HPV) vaccine  If recommended by your health care provider, you may need three doses over 6 months. Measles, mumps, and rubella (MMR) vaccine  You may need at least one dose of MMR. You may also need a second dose. Meningococcal conjugate (MenACWY) vaccine  One dose is recommended if you are age 19-21 years and a first-year college student living in a residence hall, or if you have one of several medical conditions. You may also need additional booster doses. Pneumococcal conjugate (PCV13) vaccine  You may need  this if you have certain conditions and were not previously vaccinated. Pneumococcal polysaccharide (PPSV23) vaccine  You may need one or two doses if you smoke cigarettes or if you have certain conditions. Hepatitis A vaccine  You may need this if you have certain conditions or if you travel or work in places where you may be exposed to hepatitis A. Hepatitis B vaccine  You may need this if you have certain conditions or if you travel or work in places where you may be exposed to hepatitis B. Haemophilus influenzae type b (Hib) vaccine  You may need this if you have certain conditions. You may receive vaccines as individual doses or as more than one vaccine together in one shot (combination vaccines). Talk with your health care provider about the risks and benefits of combination vaccines. What tests do I need?  Blood tests  Lipid and cholesterol levels. These may be checked every 5 years starting at age 20.  Hepatitis C test.  Hepatitis B test. Screening  Diabetes screening. This is done by checking your blood sugar (glucose) after you have not eaten for a while (fasting).  Sexually transmitted disease (STD) testing.  BRCA-related cancer screening. This may be done if you have a family history of breast, ovarian, tubal, or peritoneal cancers.  Pelvic exam and Pap test. This may be done every 3 years starting at age 21. Starting at age 30, this may be done every 5 years if you have a Pap test in combination with an HPV test. Talk with your health care provider about your test results, treatment options, and if necessary, the need for more tests.   Follow these instructions at home: Eating and drinking   Eat a diet that includes fresh fruits and vegetables, whole grains, lean protein, and low-fat dairy.  Take vitamin and mineral supplements as recommended by your health care provider.  Do not drink alcohol if: ? Your health care provider tells you not to drink. ? You are  pregnant, may be pregnant, or are planning to become pregnant.  If you drink alcohol: ? Limit how much you have to 0-1 drink a day. ? Be aware of how much alcohol is in your drink. In the [place] one drink equals one 12 oz bottle of beer (355 mL), one 5 oz glass of wine (148 mL), or one 1 oz glass of hard liquor (44 mL). Lifestyle  Take daily care of your teeth and gums.  Stay active. Exercise for at least 30 minutes on 5 or more days each week.  Do not use any products that contain nicotine or tobacco, such as cigarettes, e-cigarettes, and chewing tobacco. If you need help quitting, ask your health care provider.  If you are sexually active, practice safe sex. Use a condom or other form of birth control (contraception) in order to prevent pregnancy and STIs (sexually transmitted infections). If you plan to become pregnant, see your health care provider for a preconception visit. What's next?  Visit your health care provider once a year for a well check visit.  Ask your health care provider how often you should have your eyes and teeth checked.  Stay up to date on all vaccines. This information is not intended to replace advice given to you by your health care provider. Make sure you discuss any questions you have with your health care provider. Document Released: 02/07/2002 Document Revised: 08/23/2018 Document Reviewed: 08/23/2018 Elsevier Patient Education  2020 [employer].

## 2019-07-24 LAB — CERVICOVAGINAL ANCILLARY ONLY
Bacterial vaginitis: NEGATIVE
Candida vaginitis: NEGATIVE
Chlamydia: NEGATIVE
Neisseria Gonorrhea: NEGATIVE
Trichomonas: NEGATIVE

## 2019-08-10 ENCOUNTER — Other Ambulatory Visit: Payer: Self-pay

## 2019-08-10 ENCOUNTER — Encounter (HOSPITAL_COMMUNITY): Payer: Self-pay

## 2019-08-10 ENCOUNTER — Ambulatory Visit (HOSPITAL_COMMUNITY)
Admission: EM | Admit: 2019-08-10 | Discharge: 2019-08-10 | Disposition: A | Discharge status: Home / Self Care | Payer: Self-pay | Attending: Family Medicine | Admitting: Family Medicine

## 2019-08-10 DIAGNOSIS — N898 Other specified noninflammatory disorders of vagina: Secondary | ICD-10-CM

## 2019-08-10 DIAGNOSIS — Z3202 Encounter for pregnancy test, result negative: Secondary | ICD-10-CM

## 2019-08-10 LAB — POCT URINALYSIS DIP (DEVICE)
Bilirubin Urine: NEGATIVE
Glucose, UA: NEGATIVE mg/dL
Hgb urine dipstick: NEGATIVE
Ketones, ur: NEGATIVE mg/dL
Leukocytes,Ua: NEGATIVE
Nitrite: NEGATIVE
Protein, ur: NEGATIVE mg/dL
Specific Gravity, Urine: 1.025 (ref 1.005–1.030)
Urobilinogen, UA: 0.2 mg/dL (ref 0.0–1.0)
pH: 7 (ref 5.0–8.0)

## 2019-08-10 LAB — POCT PREGNANCY, URINE: Preg Test, Ur: NEGATIVE

## 2019-08-10 MED ORDER — METRONIDAZOLE 500 MG PO TABS: 500.0000 mg | ORAL_TABLET | Freq: Two times a day (BID) | ORAL | 0 refills | Status: DC

## 2019-08-10 MED ORDER — CLOTRIMAZOLE 1 % EX CREA: TOPICAL_CREAM | CUTANEOUS | 0 refills | Status: DC

## 2019-08-10 NOTE — ED Triage Notes (Signed)
Pt present vaginal itching with some discharge. Symptoms started two days ago.

## 2019-08-10 NOTE — Discharge Instructions (Addendum)
Take the metronidazole as prescribed for 7 days.  Do not have sex during this time.  Use the prescribed cream for vaginal irritation.    Your urine pregnancy test was negative today.    Your STD tests are pending.  If your test results are positive, we will call you and you may need to come in for treatment.  If your test results are positive, your partner may also need to be treated.

## 2019-08-10 NOTE — ED Provider Notes (Signed)
[place]    CSN: [id number] Arrival date & time: 08/10/19  1113     History   Chief Complaint Chief Complaint  Patient presents with  . Vaginal Itching    HPI Jodi Mcdonald is a 28 y.o. female.   Patient presents with 2-day history of vaginal irritation and white clear vaginal discharge.  Patient states she has a history of bacterial vaginosis and these symptoms feel the same.  She is sexually active in a monogamous relationship x3 years.  She denies abdominal pain, back pain, pelvic pain, dysuria, fever, or other symptoms.  LMP: 3 to 4 weeks, IUD.  The history is provided by the patient.    Past Medical History:  Diagnosis Date  . Bartholin cyst   . H/O chlamydia infection 2009  . H/O cystitis   . H/O gonorrhea 2012   . History of being obese   . Infection    UTI  . Trichomonas 2009    Patient Active Problem List   Diagnosis Date Noted  . Obesity (BMI 35.0-39.9 without comorbidity) 09/19/2016    Past Surgical History:  Procedure Laterality Date  . NO PAST SURGERIES    . WISDOM TOOTH EXTRACTION      OB History    Gravida  3   Para  2   Term  2   Preterm  0   AB  1   Living  2     SAB  1   TAB  0   Ectopic  0   Multiple  0   Live Births  2            Home Medications    Prior to Admission medications   Medication Sig Start Date End Date Taking? Authorizing Provider  clotrimazole (LOTRIMIN) 1 % cream Apply to affected area 2 times daily 08/10/19   Sharion Balloon, NP  metroNIDAZOLE (FLAGYL) 500 MG tablet Take 1 tablet (500 mg total) by mouth 2 (two) times daily. 08/10/19   Sharion Balloon, NP  Prenatal Vit-Fe Fumarate-FA (PRENATAL MULTIVITAMIN) TABS tablet Take 1 tablet by mouth daily.    [provider]    Family History Family History  Problem Relation Age of Onset  . Depression Mother   . Hypertension Mother   . Hypertension Father   . Diabetes Maternal Grandmother   . Diabetes Maternal Aunt   . Cancer  Paternal Aunt   . Anesthesia problems Neg Hx     Social History Social History   Tobacco Use  . Smoking status: Current Every Day Smoker    Packs/day: 2.00    Types: Cigars  . Smokeless tobacco: Former Systems developer  . Tobacco comment: 1-2 daily   Substance Use Topics  . Alcohol use: Yes    Comment: ocassionally  . Drug use: No     Allergies   Patient has no known allergies.   Review of Systems Review of Systems  Constitutional: Negative for chills and fever.  HENT: Negative for ear pain and sore throat.   Eyes: Negative for pain and visual disturbance.  Respiratory: Negative for cough and shortness of breath.   Cardiovascular: Negative for chest pain and palpitations.  Gastrointestinal: Negative for abdominal pain and vomiting.  Genitourinary: Positive for vaginal discharge. Negative for dysuria, flank pain, hematuria and pelvic pain.  Musculoskeletal: Negative for arthralgias and back pain.  Skin: Negative for color change and rash.  Neurological: Negative for seizures and syncope.  All other systems reviewed and are  negative.    Physical Exam Triage Vital Signs ED Triage Vitals  Enc Vitals Group     BP 08/10/19 1144 119/84     Pulse Rate 08/10/19 1144 78     Resp 08/10/19 1144 16     Temp 08/10/19 1144 98.4 F (36.9 C)     Temp Source 08/10/19 1144 Oral     SpO2 08/10/19 1144 100 %     Weight --      Height --      Head Circumference --      Peak Flow --      Pain Score 08/10/19 1145 0     Pain Loc --      Pain Edu? --      Excl. in GC? --    No data found.  Updated Vital Signs BP 119/84 (BP Location: Left Arm)   Pulse 78   Temp 98.4 F (36.9 C) (Oral)   Resp 16   LMP 08/02/2019   SpO2 100%   Visual Acuity Right Eye Distance:   Left Eye Distance:   Bilateral Distance:    Right Eye Near:   Left Eye Near:    Bilateral Near:     Physical Exam Vitals signs and nursing note reviewed.  Constitutional:      General: She is not in acute distress.     Appearance: She is well-developed.  HENT:     Head: Normocephalic and atraumatic.  Eyes:     Conjunctiva/sclera: Conjunctivae normal.  Neck:     Musculoskeletal: Neck supple.  Cardiovascular:     Rate and Rhythm: Normal rate and regular rhythm.     Heart sounds: No murmur.  Pulmonary:     Effort: Pulmonary effort is normal. No respiratory distress.     Breath sounds: Normal breath sounds.  Abdominal:     Palpations: Abdomen is soft.     Tenderness: There is no abdominal tenderness. There is no right CVA tenderness, left CVA tenderness, guarding or rebound.  Skin:    General: Skin is warm and dry.  Neurological:     Mental Status: She is alert.      UC Treatments / Results  Labs (all labs ordered are listed, but only abnormal results are displayed) Labs Reviewed  POC URINE PREG, ED  POCT URINALYSIS DIP (DEVICE)  POCT PREGNANCY, URINE  CERVICOVAGINAL ANCILLARY ONLY    EKG   Radiology No results found.  Procedures Procedures (including critical care time)  Medications Ordered in UC Medications - No data to display  Initial Impression / Assessment and Plan / UC Course  I have reviewed the triage vital signs and the nursing notes.  Pertinent labs & imaging results that were available during my care of the patient were reviewed by me and considered in my medical decision making (see chart for details).   Vaginal discharge.  Urine pregnancy negative.  Vaginal swab sent for gonorrhea, chlamydia, trichomonas, BV, candida.  Treating with metronidazole x7 days and clotrimazole cream.  Discussed with patient that she should not have sex during the 7 days.  Discussed with patient that her other STD tests are pending and that we will call her if they are positive requiring treatment for her and her partner.  Patient declines proactive treatment for other STDs today.  She declines HIV and RPR.  Instructed patient to return here or go to the emergency department if she develops  abdominal pain, flank pain, pelvic pain, fevers, worsening vaginal discharge, dysuria, or  other symptoms.     Final Clinical Impressions(s) / UC Diagnoses   Final diagnoses:  Vaginal discharge     Discharge Instructions     Take the metronidazole as prescribed for 7 days.  Do not have sex during this time.  Use the prescribed cream for vaginal irritation.    Your urine pregnancy test was negative today.    Your STD tests are pending.  If your test results are positive, we will call you and you may need to come in for treatment.  If your test results are positive, your partner may also need to be treated.         ED Prescriptions    Medication Sig Dispense Auth. Provider   clotrimazole (LOTRIMIN) 1 % cream Apply to affected area 2 times daily 15 g Mickie Bailate, Porsha Skilton H, NP   metroNIDAZOLE (FLAGYL) 500 MG tablet Take 1 tablet (500 mg total) by mouth 2 (two) times daily. 14 tablet Mickie Bailate, [place] Mallon H, NP     Controlled Substance Prescriptions [place] Controlled Substance Registry consulted? Not Applicable   Mickie Bailate, Nhi Butrum H, NP 08/10/19 1232

## 2019-08-13 LAB — CERVICOVAGINAL ANCILLARY ONLY
Bacterial vaginitis: POSITIVE — AB
Candida vaginitis: NEGATIVE
Chlamydia: NEGATIVE
Neisseria Gonorrhea: NEGATIVE
Trichomonas: NEGATIVE

## 2020-02-27 ENCOUNTER — Other Ambulatory Visit: Payer: Self-pay

## 2020-02-27 ENCOUNTER — Ambulatory Visit: Payer: Self-pay | Admitting: Obstetrics and Gynecology

## 2020-02-27 ENCOUNTER — Encounter: Payer: Self-pay | Admitting: Obstetrics and Gynecology

## 2020-02-27 DIAGNOSIS — Z113 Encounter for screening for infections with a predominantly sexual mode of transmission: Secondary | ICD-10-CM

## 2020-02-27 DIAGNOSIS — B9689 Other specified bacterial agents as the cause of diseases classified elsewhere: Secondary | ICD-10-CM

## 2020-02-27 DIAGNOSIS — Z30431 Encounter for routine checking of intrauterine contraceptive device: Secondary | ICD-10-CM

## 2020-02-27 DIAGNOSIS — N760 Acute vaginitis: Secondary | ICD-10-CM

## 2020-02-27 DIAGNOSIS — N898 Other specified noninflammatory disorders of vagina: Secondary | ICD-10-CM

## 2020-02-27 NOTE — Progress Notes (Signed)
Patient is in the office for IUD check, inserted 05-23-19. Pt reports some soreness at the IUD site that started 2 weeks ago,similar to when IUD was initially inserted, pt states that she can longer fill her strings. Pt also reports some discharge and odor, wants to be swabbed for BV.

## 2020-02-27 NOTE — Progress Notes (Signed)
Patient ID: Jodi Mcdonald, female   DOB: Oct 29, 1991, 29 y.o.   MRN: [id number] Jodi Mcdonald presents with c/o abd pain, vaginal discharge and unable to feel her IUD strings She is unable to give specific details in regards to her pain. Comes and goes. Not associated with intercourse. No f/c/n/v or change in bowel or bladder habits. Sexual active without problems. Last intercourse this past Sunday  Liletta placed 05/23/19 Monthly light cycles  PE AF VSS Lungs clear Heart RRR Abd soft + BS, min tenderness epigastric GU nl EGBUS, white/yellow discharge noted, IUD strings noted, swab collected Bimanual bladder non tender, uterus small mobile non tender, no adnexal masses or tenderness  A/P IUD check        Vaginal discharge  Pt reassured IUD strings noted. Pelvic exam normal. Offered GYN U/S, pt declined. Vaginal cultures collected. F/U per test results

## 2020-02-28 LAB — CERVICOVAGINAL ANCILLARY ONLY
Bacterial Vaginitis (gardnerella): POSITIVE — AB
Candida Glabrata: NEGATIVE
Candida Vaginitis: NEGATIVE
Comment: NEGATIVE
Comment: NEGATIVE
Comment: NEGATIVE
Comment: NEGATIVE
Trichomonas: NEGATIVE

## 2020-03-02 ENCOUNTER — Other Ambulatory Visit: Payer: Self-pay

## 2020-03-02 MED ORDER — METRONIDAZOLE 500 MG PO TABS: 500.0000 mg | ORAL_TABLET | Freq: Two times a day (BID) | ORAL | 0 refills | Status: DC

## 2020-04-01 ENCOUNTER — Other Ambulatory Visit: Payer: Self-pay | Admitting: *Deleted

## 2020-04-01 DIAGNOSIS — B9689 Other specified bacterial agents as the cause of diseases classified elsewhere: Secondary | ICD-10-CM

## 2020-04-01 MED ORDER — METRONIDAZOLE 500 MG PO TABS: 500.0000 mg | ORAL_TABLET | Freq: Two times a day (BID) | ORAL | 0 refills | Status: DC

## 2020-04-01 NOTE — Progress Notes (Signed)
Pt called to office stating she still has symptoms of BV- some irritation and vag odor. Pt states her partner is being treated for bacterial infection as well. Pt would like to be treated today.  Flagyl sent today. Advised to follow up in office if symptoms do not improve.

## 2022-02-15 ENCOUNTER — Other Ambulatory Visit: Payer: Self-pay

## 2022-02-15 ENCOUNTER — Ambulatory Visit (INDEPENDENT_AMBULATORY_CARE_PROVIDER_SITE_OTHER): Payer: Self-pay | Admitting: Obstetrics

## 2022-02-15 ENCOUNTER — Other Ambulatory Visit (HOSPITAL_COMMUNITY)
Admission: RE | Admit: 2022-02-15 | Discharge: 2022-02-15 | Disposition: A | Discharge status: Home / Self Care | Payer: Self-pay | Source: Ambulatory Visit | Attending: Obstetrics | Admitting: Obstetrics

## 2022-02-15 ENCOUNTER — Encounter: Payer: Self-pay | Admitting: Obstetrics

## 2022-02-15 VITALS — BP 118/83 | HR 57 | Ht 65.0 in | Wt 250.1 lb

## 2022-02-15 DIAGNOSIS — Z01419 Encounter for gynecological examination (general) (routine) without abnormal findings: Secondary | ICD-10-CM

## 2022-02-15 DIAGNOSIS — Z131 Encounter for screening for diabetes mellitus: Secondary | ICD-10-CM

## 2022-02-15 DIAGNOSIS — Z30431 Encounter for routine checking of intrauterine contraceptive device: Secondary | ICD-10-CM

## 2022-02-15 DIAGNOSIS — N898 Other specified noninflammatory disorders of vagina: Secondary | ICD-10-CM

## 2022-02-15 DIAGNOSIS — Z113 Encounter for screening for infections with a predominantly sexual mode of transmission: Secondary | ICD-10-CM

## 2022-02-15 NOTE — Progress Notes (Signed)
Pt in office today for annual exam with STD testing. She wants to make her strings for her IUD are still visible. She has no other concerns at this time.   PHQ2= 1 GAD7=10

## 2022-02-15 NOTE — Addendum Note (Signed)
Addended by: Marya Landry D on: 02/15/2022 11:24 AM   Modules accepted: Orders

## 2022-02-15 NOTE — Progress Notes (Addendum)
Subjective:        Jodi Mcdonald is a 31 y.o. female here for a routine exam.  Current complaints: Vaginal discharge.    Personal health questionnaire:  Is patient Ashkenazi Jewish, have a family history of breast and/or ovarian cancer: no Is there a family history of uterine cancer diagnosed at age < 62, gastrointestinal cancer, urinary tract cancer, family member who is a Personnel officer syndrome-associated carrier: no Is the patient overweight and hypertensive, family history of diabetes, personal history of gestational diabetes, preeclampsia or PCOS: no Is patient over 56, have PCOS,  family history of premature CHD under age 63, diabetes, smoke, have hypertension or peripheral artery disease:  no At any time, has a partner hit, kicked or otherwise hurt or frightened you?: no Over the past 2 weeks, have you felt down, depressed or hopeless?: no Over the past 2 weeks, have you felt little interest or pleasure in doing things?:no   Gynecologic History No LMP recorded. (Menstrual status: IUD). Contraception: IUD Last Pap: 03-27-2018. Results were: normal Last mammogram: n/a. Results were: n/a  Obstetric History OB History  Gravida Para Term Preterm AB Living  3 2 2  0 1 2  SAB IAB Ectopic Multiple Live Births  1 0 0 0 2    # Outcome Date GA Lbr Len/2nd Weight Sex Delivery Anes PTL Lv  3 Term 11/23/16 [redacted]w[redacted]d 07:48 / 00:46 6 lb 13.4 oz (3.1 kg) F Vag-Spont EPI  LIV  2 Term 03/06/12 [redacted]w[redacted]d / 00:21 7 lb 4.9 oz (3.314 kg) M Vag-Spont Local, EPI  LIV     Birth Comments: WNL  1 SAB             Past Medical History:  Diagnosis Date   Bartholin cyst    H/O chlamydia infection 2009   H/O cystitis    H/O gonorrhea 2012    History of being obese    Infection    UTI   Trichomonas 2009    Past Surgical History:  Procedure Laterality Date   NO PAST SURGERIES     WISDOM TOOTH EXTRACTION       Current Outpatient Medications:    metroNIDAZOLE (FLAGYL) 500 MG tablet, Take 1 tablet (500 mg  total) by mouth 2 (two) times daily., Disp: 14 tablet, Rfl: 0   Prenatal Vit-Fe Fumarate-FA (PRENATAL MULTIVITAMIN) TABS tablet, Take 1 tablet by mouth daily., Disp: , Rfl:  No Known Allergies  Social History   Tobacco Use   Smoking status: Every Day    Packs/day: 2.00    Types: Cigars, Cigarettes   Smokeless tobacco: Former   Tobacco comments:    1-2 daily   Substance Use Topics   Alcohol use: Yes    Comment: ocassionally    Family History  Problem Relation Age of Onset   Depression Mother    Hypertension Mother    Hypertension Father    Diabetes Maternal Grandmother    Diabetes Maternal Aunt    Cancer Paternal Aunt    Anesthesia problems Neg Hx       Review of Systems  Constitutional: negative for fatigue and weight loss Respiratory: negative for cough and wheezing Cardiovascular: negative for chest pain, fatigue and palpitations Gastrointestinal: negative for abdominal pain and change in bowel habits Musculoskeletal:negative for myalgias Neurological: negative for gait problems and tremors Behavioral/Psych: negative for abusive relationship, depression Endocrine: negative for temperature intolerance    Genitourinary: positive for vaginal discharge.  negative for abnormal menstrual periods, genital lesions, hot flashes,  sexual problems Integument/breast: negative for breast lump, breast tenderness, nipple discharge and skin lesion(s)    Objective:       BP 118/83    Pulse (!) 57    Ht 5\' 5"  (1.651 m)    Wt 250 lb 1.6 oz (113.4 kg)    BMI 41.62 kg/m  General:   Alert and no discharge  Skin:   no rash or abnormalities  Lungs:   clear to auscultation bilaterally  Heart:   regular rate and rhythm, S1, S2 normal, no murmur, click, rub or gallop  Breasts:   normal without suspicious masses, skin or nipple changes or axillary nodes  Abdomen:  normal findings: no organomegaly, soft, non-tender and no hernia  Pelvis:  External genitalia: normal general  appearance Urinary system: urethral meatus normal and bladder without fullness, nontender Vaginal: normal without tenderness, induration or masses Cervix: normal appearance Adnexa: normal bimanual exam Uterus: anteverted and non-tender, normal size   Lab Review Urine pregnancy test Labs reviewed yes Radiologic studies reviewed no  I have spent a total of 20 minutes of face-to-face time, excluding clinical staff time, reviewing notes and preparing to see patient, ordering tests and/or medications, and counseling the patient.   Assessment:   1. Encounter for gynecological examination with Papanicolaou smear of cervix Rx: - CBC - Comprehensive metabolic panel - TSH - HIV Antibody (routine testing w rflx) - Hepatitis B surface antigen - RPR - Hepatitis C antibody - Lipid Profile  2. Vaginal discharge Rx: - Cervicovaginal ancillary only  3. Routine screening for STI (sexually transmitted infection) Rx: - HIV Antibody (routine testing w rflx) - Hepatitis B surface antigen - RPR - Hepatitis C antibody  4. IUD check up - doing well  5. Screening for diabetes mellitus Rx: - Hemoglobin A1c    Education reviewed: calcium supplements, depression evaluation, low fat, low cholesterol diet, safe sex/STD prevention, self breast exams, and weight bearing exercise. Contraception: IUD. Follow up in: 1 year.    Orders Placed This Encounter  Procedures   CBC   Comprehensive metabolic panel   TSH   Hemoglobin A1c   HIV Antibody (routine testing w rflx)   Hepatitis B surface antigen   RPR   Hepatitis C antibody   Lipid Profile     , MD 02/15/2022 9:32 AM

## 2022-02-16 ENCOUNTER — Encounter: Payer: Self-pay | Admitting: Obstetrics

## 2022-02-16 ENCOUNTER — Other Ambulatory Visit: Payer: Self-pay | Admitting: *Deleted

## 2022-02-16 DIAGNOSIS — N760 Acute vaginitis: Secondary | ICD-10-CM

## 2022-02-16 DIAGNOSIS — B9689 Other specified bacterial agents as the cause of diseases classified elsewhere: Secondary | ICD-10-CM

## 2022-02-16 LAB — HEMOGLOBIN A1C
Est. average glucose Bld gHb Est-mCnc: 117 mg/dL
Hgb A1c MFr Bld: 5.7 % — ABNORMAL HIGH (ref 4.8–5.6)

## 2022-02-16 LAB — COMPREHENSIVE METABOLIC PANEL
ALT: 13 IU/L (ref 0–32)
AST: 11 IU/L (ref 0–40)
Albumin/Globulin Ratio: 1.4 (ref 1.2–2.2)
Albumin: 4.2 g/dL (ref 3.9–5.0)
Alkaline Phosphatase: 47 IU/L (ref 44–121)
BUN/Creatinine Ratio: 13 (ref 9–23)
BUN: 14 mg/dL (ref 6–20)
Bilirubin Total: 0.3 mg/dL (ref 0.0–1.2)
CO2: 22 mmol/L (ref 20–29)
Calcium: 9.2 mg/dL (ref 8.7–10.2)
Chloride: 105 mmol/L (ref 96–106)
Creatinine, Ser: 1.05 mg/dL — ABNORMAL HIGH (ref 0.57–1.00)
Globulin, Total: 2.9 g/dL (ref 1.5–4.5)
Glucose: 81 mg/dL (ref 70–99)
Potassium: 4.1 mmol/L (ref 3.5–5.2)
Sodium: 140 mmol/L (ref 134–144)
Total Protein: 7.1 g/dL (ref 6.0–8.5)
eGFR: 74 mL/min/{1.73_m2} (ref >59)

## 2022-02-16 LAB — HEPATITIS C ANTIBODY: Hep C Virus Ab: NONREACTIVE

## 2022-02-16 LAB — CERVICOVAGINAL ANCILLARY ONLY
Bacterial Vaginitis (gardnerella): POSITIVE — AB
Candida Glabrata: NEGATIVE
Candida Vaginitis: NEGATIVE
Chlamydia: NEGATIVE
Comment: NEGATIVE
Comment: NEGATIVE
Comment: NEGATIVE
Comment: NEGATIVE
Comment: NEGATIVE
Comment: NORMAL
Neisseria Gonorrhea: NEGATIVE
Trichomonas: NEGATIVE

## 2022-02-16 LAB — CBC
Hematocrit: 43.9 % (ref 34.0–46.6)
Hemoglobin: 14.1 g/dL (ref 11.1–15.9)
MCH: 28.8 pg (ref 26.6–33.0)
MCHC: 32.1 g/dL (ref 31.5–35.7)
MCV: 90 fL (ref 79–97)
Platelets: 338 10*3/uL (ref 150–450)
RBC: 4.9 x10E6/uL (ref 3.77–5.28)
RDW: 13.6 % (ref 11.7–15.4)
WBC: 7.3 10*3/uL (ref 3.4–10.8)

## 2022-02-16 LAB — HIV ANTIBODY (ROUTINE TESTING W REFLEX): HIV Screen 4th Generation wRfx: NONREACTIVE

## 2022-02-16 LAB — LIPID PANEL
Chol/HDL Ratio: 3.9 ratio (ref 0.0–4.4)
Cholesterol, Total: 182 mg/dL (ref 100–199)
HDL: 47 mg/dL (ref >39)
LDL Chol Calc (NIH): 123 mg/dL — ABNORMAL HIGH (ref 0–99)
Triglycerides: 65 mg/dL (ref 0–149)
VLDL Cholesterol Cal: 12 mg/dL (ref 5–40)

## 2022-02-16 LAB — HEPATITIS B SURFACE ANTIGEN: Hepatitis B Surface Ag: NEGATIVE

## 2022-02-16 LAB — TSH: TSH: 1.53 u[IU]/mL (ref 0.450–4.500)

## 2022-02-16 LAB — RPR: RPR Ser Ql: NONREACTIVE

## 2022-02-16 MED ORDER — METRONIDAZOLE 500 MG PO TABS: 500.0000 mg | ORAL_TABLET | Freq: Two times a day (BID) | ORAL | 0 refills | Status: AC

## 2022-02-16 NOTE — Progress Notes (Signed)
MyChart message from pt regarding med for BV from swab 02/15/22. RX Flagyl per protocol.

## 2022-02-17 ENCOUNTER — Encounter: Payer: Self-pay | Admitting: *Deleted

## 2022-02-17 ENCOUNTER — Other Ambulatory Visit: Payer: Self-pay | Admitting: Obstetrics

## 2022-02-17 DIAGNOSIS — D508 Other iron deficiency anemias: Secondary | ICD-10-CM

## 2022-02-17 MED ORDER — IRON POLYSACCH CMPLX-B12-FA 150-0.025-1 MG PO CAPS: 1.0000 | ORAL_CAPSULE | ORAL | 5 refills | Status: AC

## 2022-02-17 NOTE — Progress Notes (Signed)
TC to notify of elevated Hgb A1C and cholesterol and of anemia. Advised follow up with a PCP for prediabetes and high cholesterol. Advised of MyChart message sent with list of PCPs in the area and of resources on [facility] site. Pt verbalized understanding.

## 2022-02-18 LAB — CYTOLOGY - PAP: Diagnosis: NEGATIVE

## 2022-03-02 ENCOUNTER — Encounter: Payer: Self-pay | Admitting: Family Medicine
